# Patient Record
Sex: Female | Born: 1980 | Hispanic: No | Marital: Married | Smoking: Never smoker
Health system: Southern US, Community
[De-identification: ages and names within clinical notes are randomized; demographics above are authoritative.]

## PROBLEM LIST (undated history)

## (undated) ENCOUNTER — Inpatient Hospital Stay (HOSPITAL_COMMUNITY): Payer: Self-pay

## (undated) ENCOUNTER — Ambulatory Visit: Payer: BC Managed Care – PPO | Source: Home / Self Care

## (undated) DIAGNOSIS — K219 Gastro-esophageal reflux disease without esophagitis: Secondary | ICD-10-CM

## (undated) DIAGNOSIS — T7840XA Allergy, unspecified, initial encounter: Secondary | ICD-10-CM

## (undated) HISTORY — DX: Allergy, unspecified, initial encounter: T78.40XA

## (undated) HISTORY — DX: Gastro-esophageal reflux disease without esophagitis: K21.9

---

## 2002-04-16 ENCOUNTER — Other Ambulatory Visit: Admission: RE | Admit: 2002-04-16 | Discharge: 2002-04-16 | Payer: Self-pay | Admitting: *Deleted

## 2003-10-29 ENCOUNTER — Other Ambulatory Visit: Admission: RE | Admit: 2003-10-29 | Discharge: 2003-10-29 | Payer: Self-pay | Admitting: *Deleted

## 2004-11-02 ENCOUNTER — Other Ambulatory Visit: Admission: RE | Admit: 2004-11-02 | Discharge: 2004-11-02 | Payer: Self-pay | Admitting: Obstetrics and Gynecology

## 2005-11-11 ENCOUNTER — Other Ambulatory Visit: Admission: RE | Admit: 2005-11-11 | Discharge: 2005-11-11 | Payer: Self-pay | Admitting: Obstetrics and Gynecology

## 2011-01-21 ENCOUNTER — Other Ambulatory Visit (HOSPITAL_COMMUNITY): Payer: Self-pay | Admitting: Gynecology

## 2011-01-21 DIAGNOSIS — N979 Female infertility, unspecified: Secondary | ICD-10-CM

## 2011-01-26 ENCOUNTER — Ambulatory Visit (HOSPITAL_COMMUNITY): Admission: RE | Admit: 2011-01-26 | Payer: PRIVATE HEALTH INSURANCE | Source: Ambulatory Visit

## 2011-03-04 ENCOUNTER — Other Ambulatory Visit (HOSPITAL_COMMUNITY): Payer: Self-pay | Admitting: Gynecology

## 2011-03-04 DIAGNOSIS — N979 Female infertility, unspecified: Secondary | ICD-10-CM

## 2011-03-11 ENCOUNTER — Ambulatory Visit (HOSPITAL_COMMUNITY)
Admission: RE | Admit: 2011-03-11 | Discharge: 2011-03-11 | Disposition: A | Discharge status: Home / Self Care | Payer: PRIVATE HEALTH INSURANCE | Source: Ambulatory Visit | Attending: Gynecology | Admitting: Gynecology

## 2011-03-11 DIAGNOSIS — N979 Female infertility, unspecified: Secondary | ICD-10-CM

## 2012-10-27 ENCOUNTER — Ambulatory Visit: Payer: BC Managed Care – PPO | Admitting: Internal Medicine

## 2012-10-27 VITALS — BP 125/77 | HR 130 | Temp 98.6°F | Resp 17 | Ht 61.5 in | Wt 135.6 lb

## 2012-10-27 DIAGNOSIS — R197 Diarrhea, unspecified: Secondary | ICD-10-CM

## 2012-10-27 DIAGNOSIS — R1110 Vomiting, unspecified: Secondary | ICD-10-CM

## 2012-10-27 DIAGNOSIS — R110 Nausea: Secondary | ICD-10-CM

## 2012-10-27 DIAGNOSIS — R5383 Other fatigue: Secondary | ICD-10-CM

## 2012-10-27 LAB — POCT URINALYSIS DIPSTICK
Glucose, UA: NEGATIVE
Ketones, UA: 15
Leukocytes, UA: NEGATIVE
Protein, UA: NEGATIVE

## 2012-10-27 LAB — POCT UA - MICROSCOPIC ONLY: Crystals, Ur, HPF, POC: NEGATIVE

## 2012-10-27 LAB — POCT CBC
Hemoglobin: 13.1 g/dL (ref 12.2–16.2)
Lymph, poc: 2.7 (ref 0.6–3.4)
MCH, POC: 29 pg (ref 27–31.2)
MCV: 93.6 fL (ref 80–97)
MID (cbc): 0.3 (ref 0–0.9)
RBC: 4.52 M/uL (ref 4.04–5.48)
WBC: 5.4 10*3/uL (ref 4.6–10.2)

## 2012-10-27 MED ORDER — ONDANSETRON HCL 8 MG PO TABS: 8.0000 mg | ORAL_TABLET | Freq: Three times a day (TID) | ORAL | Status: DC | PRN

## 2012-10-27 MED ORDER — ONDANSETRON 4 MG PO TBDP
4.0000 mg | ORAL_TABLET | Freq: Once | ORAL | Status: AC
  Administered 2012-10-27 (×2): 4 mg via ORAL

## 2012-10-27 MED ORDER — ONDANSETRON 4 MG PO TBDP: 4.0000 mg | ORAL_TABLET | Freq: Once | ORAL | Status: DC

## 2012-10-27 NOTE — Progress Notes (Signed)
  Subjective:    Patient ID: Kathryn Erickson, female    DOB: 04/18/1981, 32 y.o.   MRN: [id number]  HPI Diarrhea and nausea and vomiting over the last 3 days, no fever noticed. Exposed to norvirus at work. No apetite, no abdominal pain, no urinary sxs. Is able to keep fluids down but nausea prevents the desire.   Review of Systems healthy    Objective:   Physical Exam  Constitutional: She appears well-developed and well-nourished. No distress.  HENT:  Right Ear: External ear normal.  Left Ear: External ear normal.  Mouth/Throat: Oropharynx is clear and moist.       Moist mms  Neck: Normal range of motion. Neck supple.  Cardiovascular: Normal rate, regular rhythm and normal heart sounds.   Pulmonary/Chest: Effort normal and breath sounds normal.  Abdominal: Soft. Bowel sounds are normal. She exhibits no mass. There is no tenderness.  Lymphadenopathy:    She has no cervical adenopathy.   Lying down 124/79 pulse 102  Stand 97/61 pulse 118  Will try oral hydration here gatorade/water/zofran 8mg  Results for orders placed in visit on 10/27/12  POCT UA - MICROSCOPIC ONLY      Component Value Range   WBC, Ur, HPF, POC 0-1     RBC, urine, microscopic 0-2     Bacteria, U Microscopic neg     Mucus, UA neg     Epithelial cells, urine per micros 1-3     Crystals, Ur, HPF, POC neg     Casts, Ur, LPF, POC neg     Yeast, UA neg    POCT URINALYSIS DIPSTICK      Component Value Range   Color, UA lt yellow     Clarity, UA clear     Glucose, UA neg     Bilirubin, UA neg     Ketones, UA 15     Spec Grav, UA 1.010     Blood, UA trace     pH, UA 7.0     Protein, UA neg     Urobilinogen, UA 0.2     Nitrite, UA neg     Leukocytes, UA Negative    POCT CBC      Component Value Range   WBC 5.4  4.6 - 10.2 K/uL   Lymph, poc 2.7  0.6 - 3.4   POC LYMPH PERCENT 50.1 (*) 10 - 50 %L   MID (cbc) 0.3  0 - 0.9   POC MID % 6.1  0 - 12 %M   POC Granulocyte 2.4  2 - 6.9   Granulocyte percent 43.8   37 - 80 %G   RBC 4.52  4.04 - 5.48 M/uL   Hemoglobin 13.1  12.2 - 16.2 g/dL   HCT, POC 78.2  95.6 - 47.9 %   MCV 93.6  80 - 97 fL   MCH, POC 29.0  27 - 31.2 pg   MCHC 31.0 (*) 31.8 - 35.4 g/dL   RDW, POC 21.3     Platelet Count, POC 360  142 - 424 K/uL   MPV 7.2  0 - 99.8 fL   Viral gastroenteritis     Assessment & Plan:  Zofran 8mg

## 2012-10-27 NOTE — Patient Instructions (Signed)
Viral Gastroenteritis Viral gastroenteritis is also known as stomach flu. This condition affects the stomach and intestinal tract. It can cause sudden diarrhea and vomiting. The illness typically lasts 3 to 8 days. Most people develop an immune response that eventually gets rid of the virus. While this natural response develops, the virus can make you quite ill. CAUSES   Many different viruses can cause gastroenteritis, such as rotavirus or noroviruses. You can catch one of these viruses by consuming contaminated food or water. You may also catch a virus by sharing utensils or other personal items with an infected person or by touching a contaminated surface. SYMPTOMS   The most common symptoms are diarrhea and vomiting. These problems can cause a severe loss of body fluids (dehydration) and a body salt (electrolyte) imbalance. Other symptoms may include:  Fever.   Headache.   Fatigue.   Abdominal pain.  DIAGNOSIS   Your caregiver can usually diagnose viral gastroenteritis based on your symptoms and a physical exam. A stool sample may also be taken to test for the presence of viruses or other infections. TREATMENT   This illness typically goes away on its own. Treatments are aimed at rehydration. The most serious cases of viral gastroenteritis involve vomiting so severely that you are not able to keep fluids down. In these cases, fluids must be given through an intravenous line (IV). HOME CARE INSTRUCTIONS    Drink enough fluids to keep your urine clear or pale yellow. Drink small amounts of fluids frequently and increase the amounts as tolerated.   Ask your caregiver for specific rehydration instructions.   Avoid:   Foods high in sugar.   Alcohol.   Carbonated drinks.   Tobacco.   Juice.   Caffeine drinks.   Extremely hot or cold fluids.   Fatty, greasy foods.   Too much intake of anything at one time.   Dairy products until 24 to 48 hours after diarrhea stops.   You  may consume probiotics. Probiotics are active cultures of beneficial bacteria. They may lessen the amount and number of diarrheal stools in adults. Probiotics can be found in yogurt with active cultures and in supplements.   Wash your hands well to avoid spreading the virus.   Only take over-the-counter or prescription medicines for pain, discomfort, or fever as directed by your caregiver. Do not give aspirin to children. Antidiarrheal medicines are not recommended.   Ask your caregiver if you should continue to take your regular prescribed and over-the-counter medicines.   Keep all follow-up appointments as directed by your caregiver.  SEEK IMMEDIATE MEDICAL CARE IF:    You are unable to keep fluids down.   You do not urinate at least once every 6 to 8 hours.   You develop shortness of breath.   You notice blood in your stool or vomit. This may look like coffee grounds.   You have abdominal pain that increases or is concentrated in one small area (localized).   You have persistent vomiting or diarrhea.   You have a fever.   The patient is a child younger than 3 months, and he or she has a fever.   The patient is a child older than 3 months, and he or she has a fever and persistent symptoms.   The patient is a child older than 3 months, and he or she has a fever and symptoms suddenly get worse.   The patient is a baby, and he or she has no tears when   crying.  MAKE SURE YOU:    Understand these instructions.   Will watch your condition.   Will get help right away if you are not doing well or get worse.  Document Released: 09/27/2005 Document Revised: 12/20/2011 Document Reviewed: 07/14/2011 Exit[employer] Patient Information 2013 [employer]   Norovirus Infection Norovirus illness is caused by a viral infection. The term norovirus refers to a group of viruses. Any of those viruses can cause norovirus illness. This illness is often referred to by other names such as viral  gastroenteritis, stomach flu, and food poisoning. Anyone can get a norovirus infection. People can have the illness multiple times during their lifetime. CAUSES   Norovirus is found in the stool or vomit of infected people. It is easily spread from person to person (contagious). People with norovirus are contagious from the moment they begin feeling ill. They may remain contagious for as long as 3 days to 2 weeks after recovery. People can become infected with the virus in several ways. This includes:  Eating food or drinking liquids that are contaminated with norovirus.   Touching surfaces or objects contaminated with norovirus, and then placing your hand in your mouth.   Having direct contact with a person who is infected and shows symptoms. This may occur while caring for someone with illness or while sharing foods or eating utensils with someone who is ill.  SYMPTOMS   Symptoms usually begin 1 to 2 days after ingestion of the virus. Symptoms may include:  Nausea.   Vomiting.   Diarrhea.   Stomach cramps.   Low-grade fever.   Chills.   Headache.   Muscle aches.   Tiredness.  Most people with norovirus illness get better within 1 to 2 days. Some people become dehydrated because they cannot drink enough liquids to replace those lost from vomiting and diarrhea. This is especially true for young children, the elderly, and others who are unable to care for themselves. DIAGNOSIS   Diagnosis is based on your symptoms and exam. Currently, only state public health laboratories have the ability to test for norovirus in stool or vomit. TREATMENT   No specific treatment exists for norovirus infections. No vaccine is available to prevent infections. Norovirus illness is usually brief in healthy people. If you are ill with vomiting and diarrhea, you should drink enough water and fluids to keep your urine clear or pale yellow. Dehydration is the most serious health effect that can result from  this infection. By drinking oral rehydration solution (ORS), people can reduce their chance of becoming dehydrated. There are many commercially available pre-made and powdered ORS designed to safely rehydrate people. These may be recommended by your caregiver. Replace any new fluid losses from diarrhea or vomiting with ORS as follows:  If your child weighs 10 kg or less (22 lb or less), give 60 to 120 ml ( to  cup or 2 to 4 oz) of ORS for each diarrheal stool or vomiting episode.   If your child weighs more than 10 kg (more than 22 lb), give 120 to 240 ml ( to 1 cup or 4 to 8 oz) of ORS for each diarrheal stool or vomiting episode.  HOME CARE INSTRUCTIONS    Follow all your caregiver's instructions.   Avoid sugar-free and alcoholic drinks while ill.   Only take over-the-counter or prescription medicines for pain, vomiting, diarrhea, or fever as directed by your caregiver.  You can decrease your chances of coming in contact with norovirus or spreading   it by following these steps:  Frequently wash your hands, especially after using the toilet, changing diapers, and before eating or preparing food.   Carefully wash fruits and vegetables. Cook shellfish before eating them.   Do not prepare food for others while you are infected and for at least 3 days after recovering from illness.   Thoroughly clean and disinfect contaminated surfaces immediately after an episode of illness using a bleach-based household cleaner.   Immediately remove and wash clothing or linens that may be contaminated with the virus.   Use the toilet to dispose of any vomit or stool. Make sure the surrounding area is kept clean.   Food that may have been contaminated by an ill person should be discarded.  SEEK IMMEDIATE MEDICAL CARE IF:    You develop symptoms of dehydration that do not improve with fluid replacement. This may include:   Excessive sleepiness.   Lack of tears.   Dry mouth.   Dizziness when  standing.   Weak pulse.  Document Released: 12/18/2002 Document Revised: 12/20/2011 Document Reviewed: 01/19/2010 [employer] Patient Information 2013 [employer].    

## 2013-10-01 ENCOUNTER — Encounter (HOSPITAL_COMMUNITY): Payer: Self-pay | Admitting: Emergency Medicine

## 2013-10-01 ENCOUNTER — Emergency Department (HOSPITAL_COMMUNITY): Payer: BC Managed Care – PPO

## 2013-10-01 ENCOUNTER — Emergency Department (HOSPITAL_COMMUNITY)
Admission: EM | Admit: 2013-10-01 | Discharge: 2013-10-01 | Disposition: A | Discharge status: Home / Self Care | Payer: BC Managed Care – PPO | Attending: Emergency Medicine | Admitting: Emergency Medicine

## 2013-10-01 DIAGNOSIS — Z9109 Other allergy status, other than to drugs and biological substances: Secondary | ICD-10-CM | POA: Insufficient documentation

## 2013-10-01 DIAGNOSIS — R000 Tachycardia, unspecified: Secondary | ICD-10-CM | POA: Insufficient documentation

## 2013-10-01 DIAGNOSIS — Z881 Allergy status to other antibiotic agents status: Secondary | ICD-10-CM | POA: Insufficient documentation

## 2013-10-01 DIAGNOSIS — R0602 Shortness of breath: Secondary | ICD-10-CM | POA: Insufficient documentation

## 2013-10-01 DIAGNOSIS — R630 Anorexia: Secondary | ICD-10-CM | POA: Insufficient documentation

## 2013-10-01 DIAGNOSIS — R197 Diarrhea, unspecified: Secondary | ICD-10-CM | POA: Insufficient documentation

## 2013-10-01 DIAGNOSIS — J069 Acute upper respiratory infection, unspecified: Secondary | ICD-10-CM | POA: Insufficient documentation

## 2013-10-01 DIAGNOSIS — R109 Unspecified abdominal pain: Secondary | ICD-10-CM | POA: Insufficient documentation

## 2013-10-01 DIAGNOSIS — R599 Enlarged lymph nodes, unspecified: Secondary | ICD-10-CM | POA: Insufficient documentation

## 2013-10-01 DIAGNOSIS — R509 Fever, unspecified: Secondary | ICD-10-CM | POA: Insufficient documentation

## 2013-10-01 DIAGNOSIS — R5381 Other malaise: Secondary | ICD-10-CM | POA: Insufficient documentation

## 2013-10-01 DIAGNOSIS — Z79899 Other long term (current) drug therapy: Secondary | ICD-10-CM | POA: Insufficient documentation

## 2013-10-01 LAB — CBC WITH DIFFERENTIAL/PLATELET
Eosinophils Absolute: 0 10*3/uL (ref 0.0–0.7)
Eosinophils Relative: 1 % (ref 0–5)
Hemoglobin: 12.5 g/dL (ref 12.0–15.0)
Lymphocytes Relative: 37 % (ref 12–46)
Lymphs Abs: 1.4 10*3/uL (ref 0.7–4.0)
MCH: 31.7 pg (ref 26.0–34.0)
MCV: 94.2 fL (ref 78.0–100.0)
Monocytes Absolute: 0.5 10*3/uL (ref 0.1–1.0)
Monocytes Relative: 13 % — ABNORMAL HIGH (ref 3–12)
RBC: 3.94 MIL/uL (ref 3.87–5.11)

## 2013-10-01 LAB — COMPREHENSIVE METABOLIC PANEL
Alkaline Phosphatase: 38 U/L — ABNORMAL LOW (ref 39–117)
BUN: 6 mg/dL (ref 6–23)
CO2: 30 mEq/L (ref 19–32)
Calcium: 8.6 mg/dL (ref 8.4–10.5)
GFR calc Af Amer: >90 mL/min (ref >90)
GFR calc non Af Amer: >90 mL/min (ref >90)
Glucose, Bld: 90 mg/dL (ref 70–99)
Potassium: 3.7 mEq/L (ref 3.5–5.1)
Total Protein: 6.9 g/dL (ref 6.0–8.3)

## 2013-10-01 LAB — RAPID STREP SCREEN (MED CTR MEBANE ONLY): Streptococcus, Group A Screen (Direct): NEGATIVE

## 2013-10-01 MED ORDER — SODIUM CHLORIDE 0.9 % IV BOLUS (SEPSIS)
1000.0000 mL | Freq: Once | INTRAVENOUS | Status: AC
  Administered 2013-10-01: 1000 mL via INTRAVENOUS

## 2013-10-01 NOTE — ED Notes (Signed)
Pt is here with sore throat, aches, diarrhea (thursday/friday).  Pt reports decreased appetite.  No vomiting.  Pt reports fever and took advil this am.  No fever at triage.  Abdominal pain yesterday.  No urinary symptoms

## 2013-10-01 NOTE — ED Notes (Signed)
Patient transported to X-ray 

## 2013-10-01 NOTE — ED Provider Notes (Signed)
I saw and evaluated the patient, reviewed the resident's note and I agree with the findings and plan.   .Face to face Exam:  General:  Awake HEENT:  Atraumatic Resp:  Normal effort Abd:  Nondistended Neuro:No focal weakness   Nelia Shi, MD 10/01/13 1301

## 2013-10-01 NOTE — ED Notes (Signed)
Pt states she started Zpack for sore throat

## 2013-10-01 NOTE — ED Provider Notes (Signed)
CSN: [id number]     Arrival date & time 10/01/13  7829 History   First MD Initiated Contact with Patient 10/01/13 1112     No chief complaint on file.  (Consider location/radiation/quality/duration/timing/severity/associated sxs/prior Treatment) HPI Ms. Tenia Vermette is a 32 y.o. female w/ no known PMHx, presents to the ED w/ complaints of mild cough, sore throat, fever, chills, and diarrhea since last Thursday. The patient claims this started with some sinus congestion and progressed to sore throat, cough, and mild SOB. The patient also admits to some fatigue and mild abdominal pain as well. She denies any nausea, vomiting, chest pain, dizziness, lightheadedness, or dysuria. The patient recently saw her PCP who prescribed her a Z-pak, and she says she took the first dose (500 mg) yesterday. She admits to having strep throat a couple times in the past, most recently last year.   Past Medical History  Diagnosis Date  . Allergy    History reviewed. No pertinent past surgical history. Family History  Problem Relation Age of Onset  . Lupus Maternal Grandmother   . Heart disease Paternal Grandmother   . Heart disease Paternal Grandfather    History  Substance Use Topics  . Smoking status: Never Smoker   . Smokeless tobacco: Not on file  . Alcohol Use: No   OB History   Grav Para Term Preterm Abortions TAB SAB Ect Mult Living                 Review of Systems General: Positive for fever, chills, decreased appetite, and fatigue. Denies diaphoresis. Respiratory: Positive for SOB. Denies cough, chest tightness, and wheezing.   Cardiovascular: Denies chest pain, palpitations and leg swelling.  Gastrointestinal: Positive for abdominal pain, diarrhea. Denies nausea, vomiting, constipation, blood in stool and abdominal distention.  Genitourinary: Denies dysuria, urgency, frequency, hematuria, flank pain and difficulty urinating.  Endocrine: Denies hot or cold intolerance, sweats, polyuria,  polydipsia. Musculoskeletal: Denies myalgias, back pain, joint swelling, arthralgias and gait problem.  Skin: Denies pallor, rash and wounds.  Neurological: Denies dizziness, seizures, syncope, weakness, lightheadedness, numbness and headaches.  Psychiatric/Behavioral: Denies mood changes, confusion, nervousness, sleep disturbance and agitation.  Allergies  Amoxicillin and Cephalexin  Home Medications   Current Outpatient Rx  Name  Route  Sig  Dispense  Refill  . diphenhydramine-acetaminophen (TYLENOL PM) 25-500 MG TABS   Oral   Take 1 tablet by mouth at bedtime as needed (sleep).         . fexofenadine (ALLEGRA) 180 MG tablet   Oral   Take 180 mg by mouth every morning.         . hydroxypropyl methylcellulose (ISOPTO TEARS) 2.5 % ophthalmic solution   Both Eyes   Place 1 drop into both eyes 4 (four) times daily as needed for dry eyes.         Marland Kitchen levocetirizine (XYZAL) 5 MG tablet   Oral   Take 5 mg by mouth every evening.         . ranitidine (ZANTAC) 150 MG tablet   Oral   Take 150 mg by mouth 2 (two) times daily.          Physical Exam Filed Vitals:   10/01/13 0944 10/01/13 1143  BP: 135/81   Pulse: 102 88  Temp: 98.5 F (36.9 C)   TempSrc: Oral   Resp: 18 16  Weight: 120 lb (54.432 kg)   SpO2: 100% 99%  General: Vital signs reviewed.  Patient is a well-developed and well-nourished,  in no acute distress and cooperative with exam. Alert and oriented x3.  Head: Normocephalic and atraumatic. Throat: Appears mildly, erythematous, no obvious exudates or drainage. Only mild cervical lymphadenopathy. Eyes: PERRL, EOMI, conjunctivae normal, No scleral icterus.  Neck: Supple, trachea midline, normal ROM, No JVD, masses, thyromegaly, or carotid bruit present.  Cardiovascular: Tachycardic, normal rhythm S1 normal, S2 normal, no murmurs, gallops, or rubs. Pulmonary/Chest: Normal respiratory effort, CTAB, no wheezes, rales, or rhonchi. Abdominal: Soft, non-tender,  non-distended, bowel sounds are normal, no masses, organomegaly, or guarding present.  Musculoskeletal: No joint deformities, erythema, or stiffness, ROM full and no nontender. Extremities: No swelling or edema,  pulses symmetric and intact bilaterally. No cyanosis or clubbing. Neurological: A&O x3, Strength is normal and symmetric bilaterally, cranial nerve II-XII are grossly intact, no focal motor deficit, sensory intact to light touch bilaterally.  Skin: Warm, dry and intact. No rashes or erythema. Psychiatric: Normal mood and affect. speech and behavior is normal. Cognition and memory are normal.    ED Course  Procedures (including critical care time) Labs Review Labs Reviewed  RAPID STREP SCREEN  CBC WITH DIFFERENTIAL  COMPREHENSIVE METABOLIC PANEL   Imaging Review No results found.  EKG Interpretation   None       MDM   Ms. Arwyn Eckmann is a 32 y.o. female w/ no known PMHx, presents to the ED w/ complaints of mild cough, sore throat, fever, chills, and diarrhea since last Thursday. Most likely viral URI at this time. Given history of sore throat, cough, and SOB, will rule out pneumonia, strep throat. Strep Pharyngitis score is 5-10% likelihood/risk of strep pharyngitis, therefore it is relatively unlikely at this time. -Rapid Strep -ve, culture pending. -CBC wnl -CMET wnl -CXR shows NACPD -Give 1L NS bolus  Most likely viral URI at this time. Patient started taking Z-pak yesterday, will advise to stop at this time. No evidence of pneumonia or strep pharyngitis. Stable for discharge with close follow up with PCP.  Courtney Paris, MD 10/01/13 1257

## 2013-10-03 LAB — CULTURE, GROUP A STREP

## 2014-04-15 ENCOUNTER — Encounter (HOSPITAL_COMMUNITY): Payer: Self-pay | Admitting: Emergency Medicine

## 2014-04-15 ENCOUNTER — Emergency Department (HOSPITAL_COMMUNITY)
Admission: EM | Admit: 2014-04-15 | Discharge: 2014-04-15 | Disposition: A | Discharge status: Home / Self Care | Payer: PRIVATE HEALTH INSURANCE | Attending: Emergency Medicine | Admitting: Emergency Medicine

## 2014-04-15 DIAGNOSIS — T50905A Adverse effect of unspecified drugs, medicaments and biological substances, initial encounter: Secondary | ICD-10-CM

## 2014-04-15 DIAGNOSIS — Z79899 Other long term (current) drug therapy: Secondary | ICD-10-CM | POA: Insufficient documentation

## 2014-04-15 DIAGNOSIS — T424X5A Adverse effect of benzodiazepines, initial encounter: Secondary | ICD-10-CM | POA: Insufficient documentation

## 2014-04-15 DIAGNOSIS — Z3202 Encounter for pregnancy test, result negative: Secondary | ICD-10-CM | POA: Insufficient documentation

## 2014-04-15 DIAGNOSIS — Z880 Allergy status to penicillin: Secondary | ICD-10-CM | POA: Insufficient documentation

## 2014-04-15 DIAGNOSIS — T391X5A Adverse effect of 4-Aminophenol derivatives, initial encounter: Secondary | ICD-10-CM | POA: Insufficient documentation

## 2014-04-15 DIAGNOSIS — F411 Generalized anxiety disorder: Secondary | ICD-10-CM | POA: Insufficient documentation

## 2014-04-15 DIAGNOSIS — R4182 Altered mental status, unspecified: Secondary | ICD-10-CM | POA: Insufficient documentation

## 2014-04-15 LAB — CBC WITH DIFFERENTIAL/PLATELET
Basophils Absolute: 0 10*3/uL (ref 0.0–0.1)
Basophils Relative: 0 % (ref 0–1)
Eosinophils Absolute: 0.1 10*3/uL (ref 0.0–0.7)
Eosinophils Relative: 1 % (ref 0–5)
HCT: 36.1 % (ref 36.0–46.0)
HEMOGLOBIN: 12.3 g/dL (ref 12.0–15.0)
LYMPHS ABS: 1.7 10*3/uL (ref 0.7–4.0)
Lymphocytes Relative: 28 % (ref 12–46)
MCH: 31.2 pg (ref 26.0–34.0)
MCHC: 34.1 g/dL (ref 30.0–36.0)
MCV: 91.6 fL (ref 78.0–100.0)
MONOS PCT: 9 % (ref 3–12)
Monocytes Absolute: 0.5 10*3/uL (ref 0.1–1.0)
NEUTROS ABS: 3.7 10*3/uL (ref 1.7–7.7)
NEUTROS PCT: 62 % (ref 43–77)
Platelets: 251 10*3/uL (ref 150–400)
RBC: 3.94 MIL/uL (ref 3.87–5.11)
RDW: 12.5 % (ref 11.5–15.5)
WBC: 6 10*3/uL (ref 4.0–10.5)

## 2014-04-15 LAB — RAPID URINE DRUG SCREEN, HOSP PERFORMED
Amphetamines: NOT DETECTED
BENZODIAZEPINES: POSITIVE — AB
Barbiturates: NOT DETECTED
Cocaine: NOT DETECTED
Opiates: NOT DETECTED
Tetrahydrocannabinol: NOT DETECTED

## 2014-04-15 LAB — ETHANOL: Alcohol, Ethyl (B): <11 mg/dL (ref 0–11)

## 2014-04-15 LAB — COMPREHENSIVE METABOLIC PANEL
ALK PHOS: 44 U/L (ref 39–117)
ALT: 17 U/L (ref 0–35)
ANION GAP: 13 (ref 5–15)
AST: 17 U/L (ref 0–37)
Albumin: 3.8 g/dL (ref 3.5–5.2)
BILIRUBIN TOTAL: 0.2 mg/dL — AB (ref 0.3–1.2)
BUN: 7 mg/dL (ref 6–23)
CHLORIDE: 103 meq/L (ref 96–112)
CO2: 23 mEq/L (ref 19–32)
Calcium: 9.1 mg/dL (ref 8.4–10.5)
Creatinine, Ser: 0.81 mg/dL (ref 0.50–1.10)
GFR calc non Af Amer: >90 mL/min (ref >90)
GLUCOSE: 113 mg/dL — AB (ref 70–99)
POTASSIUM: 3.8 meq/L (ref 3.7–5.3)
Sodium: 139 mEq/L (ref 137–147)
Total Protein: 7.1 g/dL (ref 6.0–8.3)

## 2014-04-15 LAB — SALICYLATE LEVEL

## 2014-04-15 LAB — AMMONIA: Ammonia: 15 umol/L (ref 11–60)

## 2014-04-15 LAB — POC URINE PREG, ED: Preg Test, Ur: NEGATIVE

## 2014-04-15 LAB — ACETAMINOPHEN LEVEL

## 2014-04-15 MED ORDER — SODIUM CHLORIDE 0.9 % IV SOLN
INTRAVENOUS | Status: DC
  Administered 2014-04-15: 05:00:00 via INTRAVENOUS

## 2014-04-15 NOTE — ED Notes (Signed)
Bed: [id number] Expected date:  Expected time:  Means of arrival:  Comments: EMS 84F hallucinations, OD on sleeping pills?

## 2014-04-15 NOTE — Discharge Instructions (Signed)
Do not take Tylenol PM in combination with sleeping pills.

## 2014-04-15 NOTE — ED Notes (Signed)
Per EMS report: pt took 5 tylenol PMs and her normal prescriptions of Xanax to help her sleep.  Pt went to sleep and then pt woke up and husband thought she was talking funny, having hallucinations, and lethargic.  On arrival EMS to ED, pt is a/o x 4 and talking in complete sentences without distress. Skin warm and dry. Pt ambulatory.

## 2014-04-15 NOTE — ED Provider Notes (Addendum)
CSN: [id number]     Arrival date & time 04/15/14  0345 History   First MD Initiated Contact with Patient 04/15/14 [phone]     Chief Complaint  Patient presents with  . Ingestion     (Consider location/radiation/quality/duration/timing/severity/associated sxs/prior Treatment) Patient is a 33 y.o. female presenting with Ingested Medication. The history is provided by the patient.  Ingestion   patient here complaining of altered mental status after taking Xanax and Tylenol with Benadryl. She is more depressed recently but denies that this is a suicide attempt. She normally takes 3 Tylenol p.m. at night to help her sleep. Husband denies any recent illnesses. No no fever or chills. The husband was awakened by her with altered mental status. She was experiencing hallucinations and disorganized thoughts. That has since improved. EMS was called and patient transported here. Symptoms have been gradually improving. No treatment used for this prior to arrival  Past Medical History  Diagnosis Date  . Allergy    History reviewed. No pertinent past surgical history. Family History  Problem Relation Age of Onset  . Lupus Maternal Grandmother   . Heart disease Paternal Grandmother   . Heart disease Paternal Grandfather    History  Substance Use Topics  . Smoking status: Never Smoker   . Smokeless tobacco: Not on file  . Alcohol Use: No   OB History   Grav Para Term Preterm Abortions TAB SAB Ect Mult Living                 Review of Systems  All other systems reviewed and are negative.     Allergies  Amoxicillin and Cephalexin  Home Medications   Prior to Admission medications   Medication Sig Start Date End Date Taking? Authorizing Provider  diphenhydramine-acetaminophen (TYLENOL PM) 25-500 MG TABS Take 1 tablet by mouth at bedtime as needed (sleep).    Historical Provider, MD  fexofenadine (ALLEGRA) 180 MG tablet Take 180 mg by mouth every morning.    Historical Provider, MD   hydroxypropyl methylcellulose (ISOPTO TEARS) 2.5 % ophthalmic solution Place 1 drop into both eyes 4 (four) times daily as needed for dry eyes.    Historical Provider, MD  levocetirizine (XYZAL) 5 MG tablet Take 5 mg by mouth every evening.    Historical Provider, MD  ranitidine (ZANTAC) 150 MG tablet Take 150 mg by mouth 2 (two) times daily.    Historical Provider, MD   BP 115/72  Pulse 96  Temp(Src) 98.4 F (36.9 C) (Oral)  Resp 18  Ht 5\' 2"  (1.575 m)  Wt 120 lb (54.432 kg)  BMI 21.94 kg/m2  SpO2 100%  LMP 03/28/2014 Physical Exam  Nursing note and vitals reviewed. Constitutional: She is oriented to person, place, and time. She appears well-developed and well-nourished.  Non-toxic appearance. No distress.  HENT:  Head: Normocephalic and atraumatic.  Eyes: Conjunctivae, EOM and lids are normal. Pupils are equal, round, and reactive to light.  Neck: Normal range of motion. Neck supple. No tracheal deviation present. No mass present.  Cardiovascular: Normal rate, regular rhythm and normal heart sounds.  Exam reveals no gallop.   No murmur heard. Pulmonary/Chest: Effort normal and breath sounds normal. No stridor. No respiratory distress. She has no decreased breath sounds. She has no wheezes. She has no rhonchi. She has no rales.  Abdominal: Soft. Normal appearance and bowel sounds are normal. She exhibits no distension. There is no tenderness. There is no rebound and no CVA tenderness.  Musculoskeletal: Normal range of  motion. She exhibits no edema and no tenderness.  Neurological: She is alert and oriented to person, place, and time. She has normal strength. No cranial nerve deficit or sensory deficit. GCS eye subscore is 4. GCS verbal subscore is 5. GCS motor subscore is 6.  Skin: Skin is warm and dry. No abrasion and no rash noted.  Psychiatric: Her mood appears anxious. Her speech is rapid and/or pressured. She is agitated. She is not actively hallucinating. She expresses no  suicidal plans and no homicidal plans.    ED Course  Procedures (including critical care time) Labs Review Labs Reviewed  ETHANOL  URINE RAPID DRUG SCREEN (HOSP PERFORMED)  SALICYLATE LEVEL  ACETAMINOPHEN LEVEL  CBC WITH DIFFERENTIAL  COMPREHENSIVE METABOLIC PANEL  AMMONIA  POC URINE PREG, ED    Imaging Review No results found.   EKG Interpretation   Date/Time:  Monday April 15 2014 03:55:01 EDT Ventricular Rate:  96 PR Interval:  135 QRS Duration: 97 QT Interval:  368 QTC Calculation: 465 R Axis:   75 Text Interpretation:  Sinus rhythm Borderline Q wave in anterolateral  leads Confirmed by Kealy Lewter  MD, Zooey Schreurs ([id number]) on 04/15/2014 4:43:29 AM      MDM   Final diagnoses:  None    6:14 AM Patient reevaluated multiple times and is more alert. According to her husband, she is improving at this time. Will continue to monitor and discharge him back to baseline  6:22 AM Patient's husband states that she is now at her baseline. Stable for discharge    Toy BakerAnthony T Ruchama Kubicek, MD 04/15/14 [phone]  Toy BakerAnthony T Janasha Barkalow, MD 04/15/14 [phone]

## 2015-01-21 ENCOUNTER — Inpatient Hospital Stay (HOSPITAL_COMMUNITY): Payer: PRIVATE HEALTH INSURANCE

## 2015-01-21 ENCOUNTER — Inpatient Hospital Stay (HOSPITAL_COMMUNITY)
Admission: AD | Admit: 2015-01-21 | Discharge: 2015-01-21 | Disposition: A | Discharge status: Home / Self Care | Payer: PRIVATE HEALTH INSURANCE | Source: Ambulatory Visit | Attending: Family Medicine | Admitting: Family Medicine

## 2015-01-21 ENCOUNTER — Encounter (HOSPITAL_COMMUNITY): Payer: Self-pay | Admitting: *Deleted

## 2015-01-21 DIAGNOSIS — O209 Hemorrhage in early pregnancy, unspecified: Secondary | ICD-10-CM | POA: Insufficient documentation

## 2015-01-21 DIAGNOSIS — Z3A01 Less than 8 weeks gestation of pregnancy: Secondary | ICD-10-CM | POA: Insufficient documentation

## 2015-01-21 DIAGNOSIS — R1032 Left lower quadrant pain: Secondary | ICD-10-CM | POA: Insufficient documentation

## 2015-01-21 DIAGNOSIS — N898 Other specified noninflammatory disorders of vagina: Secondary | ICD-10-CM | POA: Insufficient documentation

## 2015-01-21 LAB — URINALYSIS, ROUTINE W REFLEX MICROSCOPIC
Bilirubin Urine: NEGATIVE
GLUCOSE, UA: NEGATIVE mg/dL
KETONES UR: NEGATIVE mg/dL
LEUKOCYTES UA: NEGATIVE
Nitrite: NEGATIVE
Protein, ur: NEGATIVE mg/dL
Specific Gravity, Urine: <1.005 — ABNORMAL LOW (ref 1.005–1.030)
Urobilinogen, UA: 0.2 mg/dL (ref 0.0–1.0)
pH: 5.5 (ref 5.0–8.0)

## 2015-01-21 LAB — CBC
HCT: 36.6 % (ref 36.0–46.0)
Hemoglobin: 12.7 g/dL (ref 12.0–15.0)
MCH: 31.1 pg (ref 26.0–34.0)
MCHC: 34.7 g/dL (ref 30.0–36.0)
MCV: 89.5 fL (ref 78.0–100.0)
Platelets: 295 10*3/uL (ref 150–400)
RBC: 4.09 MIL/uL (ref 3.87–5.11)
RDW: 12.6 % (ref 11.5–15.5)
WBC: 11.3 10*3/uL — ABNORMAL HIGH (ref 4.0–10.5)

## 2015-01-21 LAB — WET PREP, GENITAL
Trich, Wet Prep: NONE SEEN
Yeast Wet Prep HPF POC: NONE SEEN

## 2015-01-21 LAB — URINE MICROSCOPIC-ADD ON

## 2015-01-21 LAB — HCG, QUANTITATIVE, PREGNANCY: hCG, Beta Chain, Quant, S: 386 m[IU]/mL — ABNORMAL HIGH (ref <5)

## 2015-01-21 LAB — POCT PREGNANCY, URINE: PREG TEST UR: POSITIVE — AB

## 2015-01-21 NOTE — Progress Notes (Signed)
Pt states she has been having a headache off and on

## 2015-01-21 NOTE — MAU Provider Note (Addendum)
History     CSN: [id number]  Arrival date and time: 01/21/15 1744   None     Chief Complaint  Patient presents with  . Vaginal Bleeding  . Abdominal Pain  . Possible Pregnancy   Vaginal Bleeding The patient's primary symptoms include vaginal bleeding. This is a new problem. The current episode started today. The problem has been unchanged. The pain is mild. She is pregnant. Associated symptoms include abdominal pain. Associated symptoms comments: Vaginal bleeding. The vaginal discharge was bloody and thin. The vaginal bleeding is lighter than menses. She has not been passing clots. She has not been passing tissue. She has tried nothing for the symptoms. It is unknown whether or not her partner has an STD.  Abdominal Pain  Possible Pregnancy Associated symptoms include abdominal pain.    34 y.o. G3P0020  presents to MAU after having light amount of bright red bleeding that started today at 400pm.  Pt states sheis having a lot of pain on her left side and some bleeding. Pt states she has positive pregnancy test. Pt states she has had to put on a pad because bleeding has"picked up a little bit"         Past Medical History  Diagnosis Date  . Allergy     History reviewed. No pertinent past surgical history.  Family History  Problem Relation Age of Onset  . Lupus Maternal Grandmother   . Heart disease Paternal Grandmother   . Heart disease Paternal Grandfather     History  Substance Use Topics  . Smoking status: Never Smoker   . Smokeless tobacco: Not on file  . Alcohol Use: No    Allergies:  Allergies  Allergen Reactions  . Amoxicillin Rash  . Cephalexin Rash    Prescriptions prior to admission  Medication Sig Dispense Refill Last Dose  . diphenhydramine-acetaminophen (TYLENOL PM) 25-500 MG TABS Take 1-2 tablets by mouth at bedtime as needed (sleep).    Past Month at Unknown time  . Prenatal Vit-Fe Fumarate-FA (PRENATAL MULTIVITAMIN) TABS tablet Take 1  tablet by mouth daily at 12 noon.   01/21/2015 at Unknown time  . ranitidine (ZANTAC) 150 MG tablet Take 150 mg by mouth daily.    01/21/2015 at Unknown time    Review of Systems  Gastrointestinal: Positive for abdominal pain.  Genitourinary: Positive for vaginal bleeding.  All other systems reviewed and are negative.  Physical Exam   Blood pressure 133/67, pulse 83, temperature 98.6 F (37 C), temperature source Oral, resp. rate 18, height 5' 0.24" (1.53 m), weight 72.122 kg (159 lb), last menstrual period 12/18/2014.  Physical Exam  Nursing note and vitals reviewed. Constitutional: She is oriented to person, place, and time. She appears well-developed and well-nourished. No distress.  HENT:  Head: Normocephalic.  Neck: Normal range of motion.  Cardiovascular: Normal rate.   Respiratory: Effort normal. No respiratory distress.  GI: Soft. There is no tenderness. There is no rebound and no guarding.  Genitourinary: Cervix exhibits discharge. There is bleeding in the vagina.  Musculoskeletal: Normal range of motion.  Neurological: She is alert and oriented to person, place, and time.  Skin: Skin is warm and dry.  Psychiatric: She has a normal mood and affect. Her behavior is normal. Judgment and thought content normal.   Results for orders placed or performed during the hospital encounter of 01/21/15 (from the past 24 hour(s))  Urinalysis, Routine w reflex microscopic     Status: Abnormal   Collection Time: 01/21/15  6:08 PM  Result Value Ref Range   Color, Urine STRAW (A) YELLOW   APPearance CLEAR CLEAR   Specific Gravity, Urine <1.005 (L) 1.005 - 1.030   pH 5.5 5.0 - 8.0   Glucose, UA NEGATIVE NEGATIVE mg/dL   Hgb urine dipstick LARGE (A) NEGATIVE   Bilirubin Urine NEGATIVE NEGATIVE   Ketones, ur NEGATIVE NEGATIVE mg/dL   Protein, ur NEGATIVE NEGATIVE mg/dL   Urobilinogen, UA 0.2 0.0 - 1.0 mg/dL   Nitrite NEGATIVE NEGATIVE   Leukocytes, UA NEGATIVE NEGATIVE  Urine  microscopic-add on     Status: Abnormal   Collection Time: 01/21/15  6:08 PM  Result Value Ref Range   Squamous Epithelial / LPF FEW (A) RARE  Pregnancy, urine POC     Status: Abnormal   Collection Time: 01/21/15  6:22 PM  Result Value Ref Range   Preg Test, Ur POSITIVE (A) NEGATIVE  ABO/Rh     Status: None (Preliminary result)   Collection Time: 01/21/15  6:50 PM  Result Value Ref Range   ABO/RH(D) A POS   hCG, quantitative, pregnancy     Status: Abnormal   Collection Time: 01/21/15  6:50 PM  Result Value Ref Range   hCG, Beta Chain, Quant, S 386 (H) <5 mIU/mL  CBC     Status: Abnormal   Collection Time: 01/21/15  6:50 PM  Result Value Ref Range   WBC 11.3 (H) 4.0 - 10.5 K/uL   RBC 4.09 3.87 - 5.11 MIL/uL   Hemoglobin 12.7 12.0 - 15.0 g/dL   HCT 16.1 09.6 - 04.5 %   MCV 89.5 78.0 - 100.0 fL   MCH 31.1 26.0 - 34.0 pg   MCHC 34.7 30.0 - 36.0 g/dL   RDW 40.9 81.1 - 91.4 %   Platelets 295 150 - 400 K/uL   US Ob Comp Less 14 Wks  01/21/2015   CLINICAL DATA:  Left lower quadrant pain, vaginal bleeding today.  EXAM: OBSTETRIC <14 WK [place] AND TRANSVAGINAL OB US  TECHNIQUE: Both transabdominal and transvaginal ultrasound examinations were performed for complete evaluation of the gestation as well as the maternal uterus, adnexal regions, and pelvic cul-de-sac. Transvaginal technique was performed to assess early pregnancy.  COMPARISON:  None.  FINDINGS: Intrauterine gestational sac: None visualized  Yolk sac:  Not visualized  Embryo:  Not visualized  Maternal uterus/adnexae: Right ovary not visualized. Complex area lateral to the left ovary measures 5.1 x 3.2 x 2.8 cm, in the area of patient's pain. Trace amount of free fluid within the pelvis.  IMPRESSION: Complex masslike area in the left adnexa measuring up to 5.1 x 3.2 x 2.8 cm with internal blood flow and trace free fluid in the pelvis. Findings are concerning for possible left ectopic pregnancy. Cannot exclude rupture.  Critical  Value/emergent results were called by telephone at the time of interpretation on 01/21/2015 at 9:46 pm to Dr. Illene Bolus , who verbally acknowledged these results.   Electronically Signed   By: Charlett Nose M.D.   On: 01/21/2015 21:48   US Ob Transvaginal  01/21/2015   CLINICAL DATA:  Left lower quadrant pain, vaginal bleeding today.  EXAM: OBSTETRIC <14 WK [place] AND TRANSVAGINAL OB US  TECHNIQUE: Both transabdominal and transvaginal ultrasound examinations were performed for complete evaluation of the gestation as well as the maternal uterus, adnexal regions, and pelvic cul-de-sac. Transvaginal technique was performed to assess early pregnancy.  COMPARISON:  None.  FINDINGS: Intrauterine gestational sac: None visualized  Yolk sac:  Not visualized  Embryo:  Not visualized  Maternal uterus/adnexae: Right ovary not visualized. Complex area lateral to the left ovary measures 5.1 x 3.2 x 2.8 cm, in the area of patient's pain. Trace amount of free fluid within the pelvis.  IMPRESSION: Complex masslike area in the left adnexa measuring up to 5.1 x 3.2 x 2.8 cm with internal blood flow and trace free fluid in the pelvis. Findings are concerning for possible left ectopic pregnancy. Cannot exclude rupture.  Critical Value/emergent results were called by telephone at the time of interpretation on 01/21/2015 at 9:46 pm to Dr. Illene BolusLORI CLEMMONS , who verbally acknowledged these results.   Electronically Signed   By: Charlett NoseKevin  Dover M.D.   On: 01/21/2015 21:48    MAU Course  Procedures  MDM Cbc Ultrasound  Beta HCG Quant ABO Dr Adrian BlackwaterStinson consulted for pt Assessment and Plan  Vaginal Bleeding in first Trimester Suspicious for Left Ectopic Pregnancy  Repeat quant in 48 hours.  Ectopic precautions given.  F/u immediately for sudden worsening of pain.   Clemmons,Lori Grissett 01/21/2015, 9:08 PM    Attestation of Attending Supervision of Advanced Practitioner (PA/CNM/NP): Evaluation and management procedures were  performed by the Advanced Practitioner under my supervision and collaboration.  I have reviewed the Advanced Practitioner's note and chart, and I agree with the management and plan.  I have seen and examined this patient.  Reported pain of 8/10 does not correlate with exam, as she appear more comfortable than that.  LLQ pain on exam, but without rebound tenderness or guarding.  I discussed this patient with Dr Debroah LoopArnold.  With trace fluid in the pelvis and the level of pain that the patient is currently in, I do not believe that she has a ruptured ectopic.  With the cystic mass in her left adnexa, it is suspicious for ectopic, but the quantitative bHCG of 386 makes definitive diagnosis less certain.  Dr Debroah LoopArnold recommended repeating quant in 48 hours to evaluate rise.  Ectopic precautions given to patient.  Rescan if symptoms are worsening or when quant reaches 1500.  Candelaria CelesteJacob Stinson, DO Attending Physician Faculty [facility], [facility]

## 2015-01-21 NOTE — Discharge Instructions (Signed)
Vaginal Bleeding During Pregnancy, First Trimester °A small amount of bleeding (spotting) from the vagina is relatively common in early pregnancy. It usually stops on its own. Various things may cause bleeding or spotting in early pregnancy. Some bleeding may be related to the pregnancy, and some may not. In most cases, the bleeding is normal and is not a problem. However, bleeding can also be a sign of something serious. Be sure to tell your health care provider about any vaginal bleeding right away. °Some possible causes of vaginal bleeding during the first trimester include: °· Infection or inflammation of the cervix. °· Growths (polyps) on the cervix. °· Miscarriage or threatened miscarriage. °· Pregnancy tissue has developed outside of the uterus and in a fallopian tube (tubal pregnancy). °· Tiny cysts have developed in the uterus instead of pregnancy tissue (molar pregnancy). °HOME CARE INSTRUCTIONS  °Watch your condition for any changes. The following actions may help to lessen any discomfort you are feeling: °· Follow your health care provider's instructions for limiting your activity. If your health care provider orders bed rest, you may need to stay in bed and only get up to use the bathroom. However, your health care provider may allow you to continue light activity. °· If needed, make plans for someone to help with your regular activities and responsibilities while you are on bed rest. °· Keep track of the number of pads you use each day, how often you change pads, and how soaked (saturated) they are. Write this down. °· Do not use tampons. Do not douche. °· Do not have sexual intercourse or orgasms until approved by your health care provider. °· If you pass any tissue from your vagina, save the tissue so you can show it to your health care provider. °· Only take over-the-counter or prescription medicines as directed by your health care provider. °· Do not take aspirin because it can make you  bleed. °· Keep all follow-up appointments as directed by your health care provider. °SEEK MEDICAL CARE IF: °· You have any vaginal bleeding during any part of your pregnancy. °· You have cramps or labor pains. °· You have a fever, not controlled by medicine. °SEEK IMMEDIATE MEDICAL CARE IF:  °· You have severe cramps in your back or belly (abdomen). °· You pass large clots or tissue from your vagina. °· Your bleeding increases. °· You feel light-headed or weak, or you have fainting episodes. °· You have chills. °· You are leaking fluid or have a gush of fluid from your vagina. °· You pass out while having a bowel movement. °MAKE SURE YOU: °· Understand these instructions. °· Will watch your condition. °· Will get help right away if you are not doing well or get worse. °Document Released: 07/07/2005 Document Revised: 10/02/2013 Document Reviewed: 06/04/2013 °ExitCare® Patient Information ©2015 [employer]. This information is not intended to replace advice given to you by your health care provider. Make sure you discuss any questions you have with your health care provider. ° °Threatened Miscarriage °A threatened miscarriage is when you have vaginal bleeding during your first 20 weeks of pregnancy but the pregnancy has not ended. Your doctor will do tests to make sure you are still pregnant. The cause of the bleeding may not be known. This condition does not mean your pregnancy will end. It does increase the risk of it ending (complete miscarriage). °HOME CARE  °· Make sure you keep all your doctor visits for prenatal care. °· Get plenty of rest. °·   Do not have sex or use tampons if you have vaginal bleeding.  Do not douche.  Do not smoke or use drugs.  Do not drink alcohol.  Avoid caffeine. GET HELP IF:  You have light bleeding from your vagina.  You have belly pain or cramping.  You have a fever. GET HELP RIGHT AWAY IF:   You have heavy bleeding from your vagina.  You have clots of blood  coming from your vagina.  You have bad pain or cramps in your low back or belly.  You have fever, chills, and bad belly pain. MAKE SURE YOU:   Understand these instructions.  Will watch your condition.  Will get help right away if you are not doing well or get worse. Document Released: 09/09/2008 Document Revised: 10/02/2013 Document Reviewed: 07/24/2013 [facility] Patient Information 2015 [place], [place]. This information is not intended to replace advice given to you by your health care provider. Make sure you discuss any questions you have with your health care provider.  Vaginal Bleeding During Pregnancy, First Trimester A small amount of bleeding (spotting) from the vagina is relatively common in early pregnancy. It usually stops on its own. Various things may cause bleeding or spotting in early pregnancy. Some bleeding may be related to the pregnancy, and some may not. In most cases, the bleeding is normal and is not a problem. However, bleeding can also be a sign of something serious. Be sure to tell your health care provider about any vaginal bleeding right away. Some possible causes of vaginal bleeding during the first trimester include:  Infection or inflammation of the cervix.  Growths (polyps) on the cervix.  Miscarriage or threatened miscarriage.  Pregnancy tissue has developed outside of the uterus and in a fallopian tube (tubal pregnancy).  Tiny cysts have developed in the uterus instead of pregnancy tissue (molar pregnancy). HOME CARE INSTRUCTIONS  Watch your condition for any changes. The following actions may help to lessen any discomfort you are feeling:  Follow your health care provider's instructions for limiting your activity. If your health care provider orders bed rest, you may need to stay in bed and only get up to use the bathroom. However, your health care provider may allow you to continue light activity.  If needed, make plans for someone to help with your  regular activities and responsibilities while you are on bed rest.  Keep track of the number of pads you use each day, how often you change pads, and how soaked (saturated) they are. Write this down.  Do not use tampons. Do not douche.  Do not have sexual intercourse or orgasms until approved by your health care provider.  If you pass any tissue from your vagina, save the tissue so you can show it to your health care provider.  Only take over-the-counter or prescription medicines as directed by your health care provider.  Do not take aspirin because it can make you bleed.  Keep all follow-up appointments as directed by your health care provider. SEEK MEDICAL CARE IF:  You have any vaginal bleeding during any part of your pregnancy.  You have cramps or labor pains.  You have a fever, not controlled by medicine. SEEK IMMEDIATE MEDICAL CARE IF:   You have severe cramps in your back or belly (abdomen).  You pass large clots or tissue from your vagina.  Your bleeding increases.  You feel light-headed or weak, or you have fainting episodes.  You have chills.  You are leaking fluid or  have a gush of fluid from your vagina.  You pass out while having a bowel movement. MAKE SURE YOU:  Understand these instructions.  Will watch your condition.  Will get help right away if you are not doing well or get worse. Document Released: 07/07/2005 Document Revised: 10/02/2013 Document Reviewed: 06/04/2013 [facility] Patient Information 2015 [place], [place]. This information is not intended to replace advice given to you by your health care provider. Make sure you discuss any questions you have with your health care provider. Ectopic Pregnancy An ectopic pregnancy is when the fertilized egg attaches (implants) outside the uterus. Most ectopic pregnancies occur in the fallopian tube. Rarely do ectopic pregnancies occur on the ovary, intestine, pelvis, or cervix. In an ectopic pregnancy, the  fertilized egg does not have the ability to develop into a normal, healthy baby.  A ruptured ectopic pregnancy is one in which the fallopian tube gets torn or bursts and results in internal bleeding. Often there is intense abdominal pain, and sometimes, vaginal bleeding. Having an ectopic pregnancy can be life threatening. If left untreated, this dangerous condition can lead to a blood transfusion, abdominal surgery, or even death. CAUSES  Damage to the fallopian tubes is the suspected cause in most ectopic pregnancies.  RISK FACTORS Depending on your circumstances, the risk of having an ectopic pregnancy will vary. The level of risk can be divided into three categories. High Risk  You have gone through infertility treatment.  You have had a previous ectopic pregnancy.  You have had previous tubal surgery.  You have had previous surgery to have the fallopian tubes tied (tubal ligation).  You have tubal problems or diseases.  You have been exposed to DES. DES is a medicine that was used until 1971 and had effects on babies whose mothers took the medicine.  You become pregnant while using an intrauterine device (IUD) for birth control. Moderate Risk  You have a history of infertility.  You have a history of a sexually transmitted infection (STI).  You have a history of pelvic inflammatory disease (PID).  You have scarring from endometriosis.  You have multiple sexual partners.  You smoke. Low Risk  You have had previous pelvic surgery.  You use vaginal douching.  You became sexually active before 32 years of age. SIGNS AND SYMPTOMS  An ectopic pregnancy should be suspected in anyone who has missed a period and has abdominal pain or bleeding.  You may experience normal pregnancy symptoms, such as:  Nausea.  Tiredness.  Breast tenderness.  Other symptoms may include:  Pain with intercourse.  Irregular vaginal bleeding or spotting.  Cramping or pain on one side  or in the lower abdomen.  Fast heartbeat.  Passing out while having a bowel movement.  Symptoms of a ruptured ectopic pregnancy and internal bleeding may include:  Sudden, severe pain in the abdomen and pelvis.  Dizziness or fainting.  Pain in the shoulder area. DIAGNOSIS  Tests that may be performed include:  A pregnancy test.  An ultrasound test.  Testing the specific level of pregnancy hormone in the bloodstream.  Taking a sample of uterus tissue (dilation and curettage, D&C).  Surgery to perform a visual exam of the inside of the abdomen using a thin, lighted tube with a tiny camera on the end (laparoscope). TREATMENT  An injection of a medicine called methotrexate may be given. This medicine causes the pregnancy tissue to be absorbed. It is given if:  The diagnosis is made early.  The fallopian  tube has not ruptured.  You are considered to be a good candidate for the medicine. Usually, pregnancy hormone blood levels are checked after methotrexate treatment. This is to be sure the medicine is effective. It may take 4-6 weeks for the pregnancy to be absorbed (though most pregnancies will be absorbed by 3 weeks). Surgical treatment may be needed. A laparoscope may be used to remove the pregnancy tissue. If severe internal bleeding occurs, a cut (incision) may be made in the lower abdomen (laparotomy), and the ectopic pregnancy is removed. This stops the bleeding. Part of the fallopian tube, or the whole tube, may be removed as well (salpingectomy). After surgery, pregnancy hormone tests may be done to be sure there is no pregnancy tissue left. You may receive a Rho (D) immune globulin shot if you are Rh negative and the father is Rh positive, or if you do not know the Rh type of the father. This is to prevent problems with any future pregnancy. SEEK IMMEDIATE MEDICAL CARE IF:  You have any symptoms of an ectopic pregnancy. This is a medical emergency. MAKE SURE  YOU:  Understand these instructions.  Will watch your condition.  Will get help right away if you are not doing well or get worse. Document Released: 11/04/2004 Document Revised: 02/11/2014 Document Reviewed: 04/26/2013 [facility] Patient Information 2015 [place]. This information is not intended to replace advice given to you by your health care provider. Make sure you discuss any questions you have with your health care provider.

## 2015-01-21 NOTE — MAU Note (Signed)
Been hurting today on the left side, getting worse and worse.  Started bleeding at 1600, dark red

## 2015-01-21 NOTE — MAU Note (Signed)
Pt states sheis having a lot of pain on her left side and some bleeding. Pt states she has positive pregnancy test. Pt states she has had to put on a pad because bleeding has"picked up a little bit"

## 2015-01-22 LAB — GC/CHLAMYDIA PROBE AMP (~~LOC~~) NOT AT ARMC
Chlamydia: NEGATIVE
Neisseria Gonorrhea: NEGATIVE

## 2015-01-22 LAB — ABO/RH: ABO/RH(D): A POS

## 2015-01-22 LAB — HIV ANTIBODY (ROUTINE TESTING W REFLEX): HIV Screen 4th Generation wRfx: NONREACTIVE

## 2015-01-23 ENCOUNTER — Inpatient Hospital Stay (HOSPITAL_COMMUNITY)
Admission: AD | Admit: 2015-01-23 | Discharge: 2015-01-23 | Disposition: A | Discharge status: Home / Self Care | Payer: PRIVATE HEALTH INSURANCE | Source: Ambulatory Visit | Attending: Obstetrics & Gynecology | Admitting: Obstetrics & Gynecology

## 2015-01-23 DIAGNOSIS — O039 Complete or unspecified spontaneous abortion without complication: Secondary | ICD-10-CM | POA: Insufficient documentation

## 2015-01-23 DIAGNOSIS — Z3A01 Less than 8 weeks gestation of pregnancy: Secondary | ICD-10-CM | POA: Insufficient documentation

## 2015-01-23 LAB — HCG, QUANTITATIVE, PREGNANCY: hCG, Beta Chain, Quant, S: 122 m[IU]/mL — ABNORMAL HIGH (ref <5)

## 2015-01-23 NOTE — MAU Note (Signed)
PT  TO RETURN  IN 1 WEEK  IN CLINIC  FOR  LABS

## 2015-01-23 NOTE — MAU Note (Signed)
PT  SAYS  SHE HAS SPOTTING  OCC WHEN SHE WIPES. -  THIS  IS LESS THAN WHEN SHE WAS HERE  ON Tuesday.    DENIES PAIN - CRAMPING.   ON Tuesday - SHE HAD  PAIN- 8..   NO MEDS  FOR  PAIN.

## 2015-01-23 NOTE — MAU Provider Note (Signed)
  History     CSN: [id number]  Arrival date and time: 01/23/15 1939   None     No chief complaint on file.  HPI  Kathryn Erickson is a 34 y.o. G3P0020 at 4310w1d who presents today for FU HCG. She denies any pain at this time. She states that her bleeding is much lighter than when she was here two day ago.  Past Medical History  Diagnosis Date  . Allergy     No past surgical history on file.  Family History  Problem Relation Age of Onset  . Lupus Maternal Grandmother   . Heart disease Paternal Grandmother   . Heart disease Paternal Grandfather     History  Substance Use Topics  . Smoking status: Never Smoker   . Smokeless tobacco: Not on file  . Alcohol Use: No    Allergies:  Allergies  Allergen Reactions  . Amoxicillin Rash  . Cephalexin Rash    Prescriptions prior to admission  Medication Sig Dispense Refill Last Dose  . Prenatal Vit-Fe Fumarate-FA (PRENATAL MULTIVITAMIN) TABS tablet Take 1 tablet by mouth daily at 12 noon.   01/21/2015 at Unknown time    ROS Physical Exam   Blood pressure 105/65, pulse 97, temperature 98.7 F (37.1 C), temperature source Oral, resp. rate 20, last menstrual period 12/18/2014.  Physical Exam  Nursing note and vitals reviewed. Constitutional: She is oriented to person, place, and time. She appears well-developed and well-nourished. No distress.  Cardiovascular: Normal rate.   Respiratory: Effort normal.  GI: Soft. There is no tenderness. There is no rebound.  Neurological: She is alert and oriented to person, place, and time.  Skin: Skin is warm and dry.  Psychiatric: She has a normal mood and affect.   Results for Elenora GammaORDAN, Mariellen (MRN [id number]) as of 01/23/2015 20:39  Ref. Range 01/21/2015 18:50 01/21/2015 20:55 01/21/2015 21:39 01/23/2015 20:00  hCG, Beta Chain, Quant, S Latest Ref Range: <5 mIU/mL 386 (H)   122 (H)   MAU Course  Procedures  MDM  Assessment and Plan   1. SAB (spontaneous abortion)    Bleeding precautions   Return to MAU as needed FU in clinic in 1 week  Follow-up Information    [facility]   Specialty:  Obstetrics and Gynecology   Why:  01/30/15 between 8-11 am    Contact information:   [address] [place] [id number] [phone]       Tawnya CrookHogan, Joselle Deeds Donovan 01/23/2015, 8:39 PM

## 2015-01-23 NOTE — MAU Note (Signed)
NOT IN LOBBY 

## 2015-01-23 NOTE — Discharge Instructions (Signed)
Miscarriage A miscarriage is the sudden loss of an unborn baby (fetus) before the 20th week of pregnancy. Most miscarriages happen in the first 3 months of pregnancy. Sometimes, it happens before a woman even knows she is pregnant. A miscarriage is also called a "spontaneous miscarriage" or "early pregnancy loss." Having a miscarriage can be an emotional experience. Talk with your caregiver about any questions you may have about miscarrying, the grieving process, and your future pregnancy plans. CAUSES   Problems with the fetal chromosomes that make it impossible for the baby to develop normally. Problems with the baby's genes or chromosomes are most often the result of errors that occur, by chance, as the embryo divides and grows. The problems are not inherited from the parents.  Infection of the cervix or uterus.   Hormone problems.   Problems with the cervix, such as having an incompetent cervix. This is when the tissue in the cervix is not strong enough to hold the pregnancy.   Problems with the uterus, such as an abnormally shaped uterus, uterine fibroids, or congenital abnormalities.   Certain medical conditions.   Smoking, drinking alcohol, or taking illegal drugs.   Trauma.  Often, the cause of a miscarriage is unknown.  SYMPTOMS   Vaginal bleeding or spotting, with or without cramps or pain.  Pain or cramping in the abdomen or lower back.  Passing fluid, tissue, or blood clots from the vagina. DIAGNOSIS  Your caregiver will perform a physical exam. You may also have an ultrasound to confirm the miscarriage. Blood or urine tests may also be ordered. TREATMENT   Sometimes, treatment is not necessary if you naturally pass all the fetal tissue that was in the uterus. If some of the fetus or placenta remains in the body (incomplete miscarriage), tissue left behind may become infected and must be removed. Usually, a dilation and curettage (D and C) procedure is performed.  During a D and C procedure, the cervix is widened (dilated) and any remaining fetal or placental tissue is gently removed from the uterus.  Antibiotic medicines are prescribed if there is an infection. Other medicines may be given to reduce the size of the uterus (contract) if there is a lot of bleeding.  If you have Rh negative blood and your baby was Rh positive, you will need a Rh immunoglobulin shot. This shot will protect any future baby from having Rh blood problems in future pregnancies. HOME CARE INSTRUCTIONS   Your caregiver may order bed rest or may allow you to continue light activity. Resume activity as directed by your caregiver.  Have someone help with home and family responsibilities during this time.   Keep track of the number of sanitary pads you use each day and how soaked (saturated) they are. Write down this information.   Do not use tampons. Do not douche or have sexual intercourse until approved by your caregiver.   Only take over-the-counter or prescription medicines for pain or discomfort as directed by your caregiver.   Do not take aspirin. Aspirin can cause bleeding.   Keep all follow-up appointments with your caregiver.   If you or your partner have problems with grieving, talk to your caregiver or seek counseling to help cope with the pregnancy loss. Allow enough time to grieve before trying to get pregnant again.  SEEK IMMEDIATE MEDICAL CARE IF:   You have severe cramps or pain in your back or abdomen.  You have a fever.  You pass large blood clots (walnut-sized   or larger) ortissue from your vagina. Save any tissue for your caregiver to inspect.   Your bleeding increases.   You have a thick, bad-smelling vaginal discharge.  You become lightheaded, weak, or you faint.   You have chills.  MAKE SURE YOU:  Understand these instructions.  Will watch your condition.  Will get help right away if you are not doing well or get  worse. Document Released: 03/23/2001 Document Revised: 01/22/2013 Document Reviewed: 11/16/2011 Exit[employer] Patient Information 2015 [employer]. This information is not intended to replace advice given to you by your health care provider. Make sure you discuss any questions you have with your health care provider.  

## 2015-01-30 ENCOUNTER — Other Ambulatory Visit: Payer: PRIVATE HEALTH INSURANCE

## 2015-01-30 ENCOUNTER — Telehealth: Payer: Self-pay | Admitting: Obstetrics & Gynecology

## 2015-01-30 NOTE — Telephone Encounter (Signed)
Left message for patient to return call to clinic.

## 2015-11-26 ENCOUNTER — Encounter (HOSPITAL_COMMUNITY): Payer: Self-pay | Admitting: *Deleted

## 2016-03-14 ENCOUNTER — Encounter (HOSPITAL_COMMUNITY): Payer: Self-pay | Admitting: Emergency Medicine

## 2016-03-14 ENCOUNTER — Emergency Department (HOSPITAL_COMMUNITY)
Admission: EM | Admit: 2016-03-14 | Discharge: 2016-03-14 | Disposition: A | Discharge status: Home / Self Care | Payer: Commercial Managed Care - HMO | Attending: Emergency Medicine | Admitting: Emergency Medicine

## 2016-03-14 DIAGNOSIS — Z79899 Other long term (current) drug therapy: Secondary | ICD-10-CM | POA: Diagnosis not present

## 2016-03-14 DIAGNOSIS — Y929 Unspecified place or not applicable: Secondary | ICD-10-CM | POA: Diagnosis not present

## 2016-03-14 DIAGNOSIS — W57XXXA Bitten or stung by nonvenomous insect and other nonvenomous arthropods, initial encounter: Secondary | ICD-10-CM | POA: Diagnosis not present

## 2016-03-14 DIAGNOSIS — Y999 Unspecified external cause status: Secondary | ICD-10-CM | POA: Diagnosis not present

## 2016-03-14 DIAGNOSIS — Y939 Activity, unspecified: Secondary | ICD-10-CM | POA: Insufficient documentation

## 2016-03-14 DIAGNOSIS — S90464A Insect bite (nonvenomous), right lesser toe(s), initial encounter: Secondary | ICD-10-CM | POA: Diagnosis present

## 2016-03-14 DIAGNOSIS — L03031 Cellulitis of right toe: Secondary | ICD-10-CM | POA: Diagnosis not present

## 2016-03-14 MED ORDER — DOXYCYCLINE HYCLATE 100 MG PO TABS
100.0000 mg | ORAL_TABLET | Freq: Once | ORAL | Status: AC
  Administered 2016-03-14: 100 mg via ORAL
  Filled 2016-03-14: qty 1

## 2016-03-14 MED ORDER — DOXYCYCLINE HYCLATE 100 MG PO CAPS: 100.0000 mg | ORAL_CAPSULE | Freq: Two times a day (BID) | ORAL | Status: DC

## 2016-03-14 NOTE — ED Provider Notes (Signed)
CSN: [id number]     Arrival date & time 03/14/16  1840 History  By signing my name below, I, Kathryn Erickson, attest that this documentation has been prepared under the direction and in the presence of [facility] CenterEmily Bev Drennen, PA-C. Electronically Signed: Tanda RockersMargaux Erickson, ED Scribe. 03/14/2016. 7:52 PM.   Chief Complaint  Patient presents with  . Insect Bite   The history is provided by the patient. No language interpreter was used.   HPI Comments: Kathryn Erickson is a 35 y.o. female who presents to the Emergency Department complaining of gradual onset, constant, throbbing, right 3rd and 4th toe pain, swelling, and redness x 4 days. Pt reports that she noticed a tick at the web spaces between the toes which she removed 4 days ago. She went camping 1 week ago and believes she may have been bitten by the tick then. She has been having chills, headaches, back pain, and nasal congestion for the past 2 days. Pt does not typically have headaches. No recent sick contact with similar symptoms. No risk of pregnancy. Denies fever, body aches, vomiting, diarrhea, rash, red streaking, or any other associated symptoms.   Past Medical History  Diagnosis Date  . Allergy    History reviewed. No pertinent past surgical history. Family History  Problem Relation Age of Onset  . Lupus Maternal Grandmother   . Heart disease Paternal Grandmother   . Heart disease Paternal Grandfather    Social History  Substance Use Topics  . Smoking status: Never Smoker   . Smokeless tobacco: None  . Alcohol Use: No   OB History    Gravida Para Term Preterm AB TAB SAB Ectopic Multiple Living   3    2  2         Review of Systems  Constitutional: Positive for chills. Negative for fever.  Gastrointestinal: Negative for nausea, vomiting and diarrhea.  Musculoskeletal: Positive for back pain, joint swelling and arthralgias. Negative for myalgias.  Skin: Positive for color change. Negative for rash.  Neurological: Positive for headaches.   All other systems reviewed and are negative.     Allergies  Amoxicillin and Cephalexin  Home Medications   Prior to Admission medications   Medication Sig Start Date End Date Taking? Authorizing Provider  Prenatal Vit-Fe Fumarate-FA (PRENATAL MULTIVITAMIN) TABS tablet Take 1 tablet by mouth daily at 12 noon.    Historical Provider, MD   BP 115/84 mmHg  Pulse 85  Temp(Src) 98.4 F (36.9 C) (Oral)  Resp 16  Ht 5\' 1"  (1.549 m)  Wt 153 lb (69.4 kg)  BMI 28.92 kg/m2  SpO2 99%  LMP 03/02/2016 (Exact Date)   Physical Exam  Constitutional: She appears well-developed and well-nourished. No distress.  HENT:  Head: Normocephalic and atraumatic.  Eyes: Conjunctivae are normal.  Neck: Normal range of motion. Neck supple.  Cardiovascular: Normal rate and regular rhythm.   Pulmonary/Chest: Effort normal and breath sounds normal. No respiratory distress. She has no wheezes. She has no rales.  Abdominal: Soft. She exhibits no distension. There is no tenderness. There is no rebound and no guarding.  Musculoskeletal: She exhibits edema and tenderness.  Erythema, edema, and tenderness at the base of the 3rd toe (betwen 3rd and 4th) on the right foot. No fluctuance or drainage.   Neurological: She is alert.  Skin: She is not diaphoretic. There is erythema.  Nursing note and vitals reviewed.   ED Course  Procedures (including critical care time)  DIAGNOSTIC STUDIES: Oxygen Saturation is 99% on RA,  normal by my interpretation.    COORDINATION OF CARE: 7:50 PM-Discussed treatment plan which includes Rx antibiotics with pt at bedside and pt agreed to plan.    MDM   Final diagnoses:  Tick bite  Cellulitis of toe of right foot   Afebrile, nontoxic patient with engorged tick found 4 days ago between toes several days after camping trip.  Now with cellulitis between toes.  Clinically no abscess.  Pt is afebrile and nontoxic appearing but c/o headache, chills, fatigue.   D/C home with  doxycycline x 7 days.  Discussed result, findings, treatment, and follow up  with patient.  Pt given return precautions.  Pt verbalizes understanding and agrees with plan.        I personally performed the services described in this documentation, which was scribed in my presence. The recorded information has been reviewed and is accurate.      Trixie Dredge, PA-C 03/14/16 2130  Lorre Nick, MD 03/14/16 2252

## 2016-03-14 NOTE — ED Notes (Signed)
Pt removed tick from between toes on right foot approximately 3 nights ago. Redness and swelling at the site. Denies fever.

## 2016-03-14 NOTE — Discharge Instructions (Signed)
Read the information below.  Use the prescribed medication as directed.  Please discuss all new medications with your pharmacist.  You may return to the Emergency Department at any time for worsening condition or any new symptoms that concern you.   If you develop fevers, uncontrolled pain, uncontrolled vomiting, or difficulty breathing or swallowing return to the ER for a recheck.      Tick Bite Information Ticks are insects that attach themselves to the skin and draw blood for food. There are various types of ticks. Common types include wood ticks and deer ticks. Most ticks live in shrubs and grassy areas. Ticks can climb onto your body when you make contact with leaves or grass where the tick is waiting. The most common places on the body for ticks to attach themselves are the scalp, neck, armpits, waist, and groin. Most tick bites are harmless, but sometimes ticks carry germs that cause diseases. These germs can be spread to a person during the tick's feeding process. The chance of a disease spreading through a tick bite depends on:   The type of tick.  Time of year.   How long the tick is attached.   Geographic location.  HOW CAN YOU PREVENT TICK BITES? Take these steps to help prevent tick bites when you are outdoors:  Wear protective clothing. Long sleeves and long pants are best.   Wear white clothes so you can see ticks more easily.  Tuck your pant legs into your socks.   If walking on a trail, stay in the middle of the trail to avoid brushing against bushes.  Avoid walking through areas with long grass.  Put insect repellent on all exposed skin and along boot tops, pant legs, and sleeve cuffs.   Check clothing, hair, and skin repeatedly and before going inside.   Brush off any ticks that are not attached.  Take a shower or bath as soon as possible after being outdoors.  WHAT IS THE PROPER WAY TO REMOVE A TICK? Ticks should be removed as soon as possible to help  prevent diseases caused by tick bites. 1. If latex gloves are available, put them on before trying to remove a tick.  2. Using fine-point tweezers, grasp the tick as close to the skin as possible. You may also use curved forceps or a tick removal tool. Grasp the tick as close to its head as possible. Avoid grasping the tick on its body. 3. Pull gently with steady upward pressure until the tick lets go. Do not twist the tick or jerk it suddenly. This may break off the tick's head or mouth parts. 4. Do not squeeze or crush the tick's body. This could force disease-carrying fluids from the tick into your body.  5. After the tick is removed, wash the bite area and your hands with soap and water or other disinfectant such as alcohol. 6. Apply a small amount of antiseptic cream or ointment to the bite site.  7. Wash and disinfect any instruments that were used.  Do not try to remove a tick by applying a hot match, petroleum jelly, or fingernail polish to the tick. These methods do not work and may increase the chances of disease being spread from the tick bite.  WHEN SHOULD YOU SEEK MEDICAL CARE? Contact your health care provider if you are unable to remove a tick from your skin or if a part of the tick breaks off and is stuck in the skin.  After a tick  bite, you need to be aware of signs and symptoms that could be related to diseases spread by ticks. Contact your health care provider if you develop any of the following in the days or weeks after the tick bite:  Unexplained fever.  Rash. A circular rash that appears days or weeks after the tick bite may indicate the possibility of Lyme disease. The rash may resemble a target with a bull's-eye and may occur at a different part of your body than the tick bite.  Redness and swelling in the area of the tick bite.   Tender, swollen lymph glands.   Diarrhea.   Weight loss.   Cough.   Fatigue.   Muscle, joint, or bone pain.   Abdominal  pain.   Headache.   Lethargy or a change in your level of consciousness.  Difficulty walking or moving your legs.   Numbness in the legs.   Paralysis.  Shortness of breath.   Confusion.   Repeated vomiting.    This information is not intended to replace advice given to you by your health care provider. Make sure you discuss any questions you have with your health care provider.   Document Released: 09/24/2000 Document Revised: 10/18/2014 Document Reviewed: 03/07/2013 Elsevier Interactive Patient Education [employer]

## 2016-05-16 ENCOUNTER — Encounter (HOSPITAL_COMMUNITY): Payer: Self-pay | Admitting: *Deleted

## 2016-05-16 ENCOUNTER — Emergency Department (HOSPITAL_COMMUNITY)
Admission: EM | Admit: 2016-05-16 | Discharge: 2016-05-16 | Disposition: A | Discharge status: Home / Self Care | Payer: Commercial Managed Care - HMO | Attending: Emergency Medicine | Admitting: Emergency Medicine

## 2016-05-16 DIAGNOSIS — W57XXXA Bitten or stung by nonvenomous insect and other nonvenomous arthropods, initial encounter: Secondary | ICD-10-CM | POA: Diagnosis not present

## 2016-05-16 DIAGNOSIS — L03116 Cellulitis of left lower limb: Secondary | ICD-10-CM | POA: Insufficient documentation

## 2016-05-16 DIAGNOSIS — Y999 Unspecified external cause status: Secondary | ICD-10-CM | POA: Insufficient documentation

## 2016-05-16 DIAGNOSIS — Y939 Activity, unspecified: Secondary | ICD-10-CM | POA: Diagnosis not present

## 2016-05-16 DIAGNOSIS — Y929 Unspecified place or not applicable: Secondary | ICD-10-CM | POA: Insufficient documentation

## 2016-05-16 DIAGNOSIS — S70362A Insect bite (nonvenomous), left thigh, initial encounter: Secondary | ICD-10-CM | POA: Diagnosis present

## 2016-05-16 MED ORDER — SULFAMETHOXAZOLE-TRIMETHOPRIM 800-160 MG PO TABS
1.0000 | ORAL_TABLET | Freq: Once | ORAL | Status: AC
  Administered 2016-05-16: 1 via ORAL
  Filled 2016-05-16: qty 1

## 2016-05-16 MED ORDER — SULFAMETHOXAZOLE-TRIMETHOPRIM 800-160 MG PO TABS: 1.0000 | ORAL_TABLET | Freq: Two times a day (BID) | ORAL | 0 refills | Status: DC

## 2016-05-16 NOTE — Progress Notes (Signed)
Pt has a spider bite ot the left side of her left thigh.

## 2016-05-16 NOTE — ED Triage Notes (Signed)
Pt complains of red raised area on left posterior thigh. Pt states she was camping over the weekend and noticed the area this morning. Pt states her friends saw teeth marks in the area.

## 2016-05-16 NOTE — ED Provider Notes (Signed)
[facility]-EMERGENCY DEPT Provider Note   CSN: [id number] Arrival date & time: 05/16/16  1704  First Provider Contact:   First MD Initiated Contact with Patient 05/16/16 1736     By signing my name below, I, Freida Busman, attest that this documentation has been prepared under the direction and in the presence of non-physician practitioner, Trixie Dredge, PA-C. Electronically Signed: Freida Busman, Scribe. 05/16/2016. 5:44 PM.   History   Chief Complaint Chief Complaint  Patient presents with  . Insect Bite   The history is provided by the patient. No language interpreter was used.    HPI Comments:  Kathryn Erickson is a 35 y.o. female who presents to the Emergency Department complaining of a circular area of redness to the  left thigh which she noticed today. She reports moderate pain and increased warmth to the site. Pt returned from camping trip (stayed in their camper) today believes she may have been bitten by an insect or spider, but does not remember this occuring. Her husband checked the area, no tick bite was seen.  She denies fever, chills, SOB and pruritus. No alleviating factors noted.   Past Medical History:  Diagnosis Date  . Allergy     There are no active problems to display for this patient.   History reviewed. No pertinent surgical history.  OB History    Gravida Para Term Preterm AB Living   3       2     SAB TAB Ectopic Multiple Live Births   2               Home Medications    Prior to Admission medications   Medication Sig Start Date End Date Taking? Authorizing Provider  Prenatal Vit-Fe Fumarate-FA (PRENATAL MULTIVITAMIN) TABS tablet Take 1 tablet by mouth daily at 12 noon.    Historical Provider, MD  sulfamethoxazole-trimethoprim (BACTRIM DS,SEPTRA DS) 800-160 MG tablet Take 1 tablet by mouth 2 (two) times daily. X 7 days 05/16/16   Trixie Dredge, PA-C    Family History Family History  Problem Relation Age of Onset  . Lupus Maternal Grandmother   . Heart  disease Paternal Grandmother   . Heart disease Paternal Grandfather     Social History Social History  Substance Use Topics  . Smoking status: Never Smoker  . Smokeless tobacco: Never Used  . Alcohol use No     Allergies   Amoxicillin and Cephalexin   Review of Systems Review of Systems  Constitutional: Negative for chills and fever.  Respiratory: Negative for shortness of breath.   Musculoskeletal: Negative for myalgias.  Skin: Positive for color change (erythema). Negative for wound.  Allergic/Immunologic: Negative for immunocompromised state.  Hematological: Does not bruise/bleed easily.  Psychiatric/Behavioral: Negative for self-injury.     Physical Exam Updated Vital Signs BP 124/92 (BP Location: Right Arm)   Pulse 108   Temp 98.4 F (36.9 C) (Oral)   Resp 18   LMP 05/02/2016   SpO2 100%   Physical Exam  Constitutional: She appears well-developed and well-nourished. No distress.  HENT:  Head: Normocephalic and atraumatic.  Neck: Neck supple.  Pulmonary/Chest: Effort normal.  Neurological: She is alert.  Skin: She is not diaphoretic.  Circular area on left lateral thigh that is erythematous, warmth and tenderness to palpation. No fluctuance, induration or discharge. No other focal areas of tenderness or edema. Compartments are soft   Psychiatric: Her behavior is normal.  Nursing note and vitals reviewed.    ED Treatments /  Results  DIAGNOSTIC STUDIES:  Oxygen Saturation is 100% on RA, normal by my interpretation.    COORDINATION OF CARE:  5:42 PM Discussed treatment plan with pt at bedside and pt agreed to plan.  Labs (all labs ordered are listed, but only abnormal results are displayed) Labs Reviewed - No data to display  EKG  EKG Interpretation None       Radiology No results found.  Procedures Procedures (including critical care time)  Medications Ordered in ED Medications  sulfamethoxazole-trimethoprim (BACTRIM DS,SEPTRA DS)  800-160 MG per tablet 1 tablet (not administered)     Initial Impression / Assessment and Plan / ED Course  I have reviewed the triage vital signs and the nursing notes.  Pertinent labs & imaging results that were available during my care of the patient were reviewed by me and considered in my medical decision making (see chart for details).  Clinical Course      Final Clinical Impressions(s) / ED Diagnoses  Patient presentation consistent with cellulitis vs insect sting. Pt is  Afebrile. No tachycardia, hypotension or other symptoms suggestive of severe infection. Pt advised to follow up for wound check in 2-3 days, sooner for worsening systemic symptoms.  Will discharge with bactrim. Return precautions discussed. Pt appears safe for discharge. Discussed result, findings, treatment, and follow up  with patient.  Pt given return precautions.  Pt verbalizes understanding and agrees with plan.      Final diagnoses:  Cellulitis of left lower extremity    New Prescriptions New Prescriptions   SULFAMETHOXAZOLE-TRIMETHOPRIM (BACTRIM DS,SEPTRA DS) 800-160 MG TABLET    Take 1 tablet by mouth 2 (two) times daily. X 7 days    I personally performed the services described in this documentation, which was scribed in my presence. The recorded information has been reviewed and is accurate.     LakelandEmily Shadiyah Wernli, PA-C 05/16/16 1812    Alvira MondayErin Schlossman, MD 05/18/16 [phone]

## 2016-05-16 NOTE — Discharge Instructions (Signed)
Read the information below.  Use the prescribed medication as directed.  Please discuss all new medications with your pharmacist.  You may return to the Emergency Department at any time for worsening condition or any new symptoms that concern you.   If you develop increased redness, swelling, pus draining from the wound, or fevers greater than 100.4, return to the ER immediately for a recheck.   °

## 2017-05-19 ENCOUNTER — Encounter (HOSPITAL_COMMUNITY): Payer: Self-pay | Admitting: Student

## 2017-05-19 ENCOUNTER — Inpatient Hospital Stay (HOSPITAL_COMMUNITY)
Admission: AD | Admit: 2017-05-19 | Discharge: 2017-05-19 | Disposition: A | Discharge status: Home / Self Care | Payer: Commercial Managed Care - HMO | Source: Ambulatory Visit | Attending: Obstetrics & Gynecology | Admitting: Obstetrics & Gynecology

## 2017-05-19 DIAGNOSIS — N912 Amenorrhea, unspecified: Secondary | ICD-10-CM | POA: Insufficient documentation

## 2017-05-19 DIAGNOSIS — Z3202 Encounter for pregnancy test, result negative: Secondary | ICD-10-CM

## 2017-05-19 LAB — HCG, QUANTITATIVE, PREGNANCY: hCG, Beta Chain, Quant, S: <1 m[IU]/mL (ref <5)

## 2017-05-19 LAB — URINALYSIS, ROUTINE W REFLEX MICROSCOPIC
BILIRUBIN URINE: NEGATIVE
Bacteria, UA: NONE SEEN
GLUCOSE, UA: NEGATIVE mg/dL
KETONES UR: NEGATIVE mg/dL
Leukocytes, UA: NEGATIVE
Nitrite: NEGATIVE
PH: 6 (ref 5.0–8.0)
Protein, ur: NEGATIVE mg/dL
Specific Gravity, Urine: 1.019 (ref 1.005–1.030)

## 2017-05-19 LAB — POCT PREGNANCY, URINE: Preg Test, Ur: NEGATIVE

## 2017-05-19 NOTE — [facility] Provider Note (Signed)
History     CSN: [id number]  Arrival date and time: 05/19/17 1043  First Provider Initiated Contact with Patient 05/19/17 1120      Chief Complaint  Patient presents with  . Possible Pregnancy  . Abdominal Pain   HPI Kathryn Erickson is a 36 y.o. G35P0030 female who presents with possible pregnancy & abdominal cramping. LMP was 2 months ago. States prior to then has regular monthly menses. No contraception in the last several years. Has taken multiple pregnancy tests in the last month; states some have been faint positive "she thinks" and some negative. Last faint positive was yesterday, followed by a negative test.  Reports intermittent lower abdominal cramping last week. Reports lower abdominal pressure today. Denies n/v/d, vaginal bleeding, vaginal discharge, or dysuria.  OB History    Gravida Para Term Preterm AB Living   3       3     SAB TAB Ectopic Multiple Live Births   3              Past Medical History:  Diagnosis Date  . Allergy     No past surgical history on file.  Family History  Problem Relation Age of Onset  . Lupus Maternal Grandmother   . Heart disease Paternal Grandmother   . Heart disease Paternal Grandfather     Social History  Substance Use Topics  . Smoking status: Never Smoker  . Smokeless tobacco: Never Used  . Alcohol use No    Allergies:  Allergies  Allergen Reactions  . Amoxicillin Rash  . Cephalexin Rash    Prescriptions Prior to Admission  Medication Sig Dispense Refill Last Dose  . Prenatal Vit-Fe Fumarate-FA (PRENATAL MULTIVITAMIN) TABS tablet Take 1 tablet by mouth daily at 12 noon.   01/21/2015 at Unknown time  . sulfamethoxazole-trimethoprim (BACTRIM DS,SEPTRA DS) 800-160 MG tablet Take 1 tablet by mouth 2 (two) times daily. X 7 days 14 tablet 0     Review of Systems  Constitutional: Negative.   Gastrointestinal: Positive for abdominal pain (none today). Negative for constipation, diarrhea, nausea and vomiting.  Genitourinary:  Negative.    Physical Exam   Blood pressure 118/72, pulse 88, temperature 98.3 F (36.8 C), resp. rate 18, height 5\' 1"  (1.549 m), weight 209 lb (94.8 kg), last menstrual period 03/11/2017, unknown if currently breastfeeding.  Physical Exam  Nursing note and vitals reviewed. Constitutional: She is oriented to person, place, and time. She appears well-developed and well-nourished. No distress.  HENT:  Head: Normocephalic and atraumatic.  Eyes: Conjunctivae are normal. Right eye exhibits no discharge. Left eye exhibits no discharge. No scleral icterus.  Neck: Normal range of motion.  Respiratory: Effort normal. No respiratory distress.  Neurological: She is alert and oriented to person, place, and time.  Skin: She is not diaphoretic.  Psychiatric: She has a normal mood and affect. Her behavior is normal. Judgment and thought content normal.    MAU Course  Procedures Results for orders placed or performed during the hospital encounter of 05/19/17 (from the past 24 hour(s))  Urinalysis, Routine w reflex microscopic     Status: Abnormal   Collection Time: 05/19/17 10:52 AM  Result Value Ref Range   Color, Urine YELLOW YELLOW   APPearance CLEAR CLEAR   Specific Gravity, Urine 1.019 1.005 - 1.030   pH 6.0 5.0 - 8.0   Glucose, UA NEGATIVE NEGATIVE mg/dL   Hgb urine dipstick SMALL (A) NEGATIVE   Bilirubin Urine NEGATIVE NEGATIVE   Ketones, ur  NEGATIVE NEGATIVE mg/dL   Protein, ur NEGATIVE NEGATIVE mg/dL   Nitrite NEGATIVE NEGATIVE   Leukocytes, UA NEGATIVE NEGATIVE   RBC / HPF 0-5 0 - 5 RBC/hpf   WBC, UA 0-5 0 - 5 WBC/hpf   Bacteria, UA NONE SEEN NONE SEEN   Squamous Epithelial / LPF 0-5 (A) NONE SEEN   Mucous PRESENT   Pregnancy, urine POC     Status: None   Collection Time: 05/19/17 11:06 AM  Result Value Ref Range   Preg Test, Ur NEGATIVE NEGATIVE  hCG, quantitative, pregnancy     Status: None   Collection Time: 05/19/17 11:20 AM  Result Value Ref Range   hCG, Beta Chain,  Quant, S <1 <5 mIU/mL    MDM Negative UPT. BHCG ordered per protocol as patient reports positive HPT.  BHCG <1 Assessment and Plan  A: 1. Pregnancy examination or test, negative result   2. Amenorrhea    P: Discharge home F/u with gyn of choice Discussed reasons to return to MAU  Judeth HornErin Carliss Porcaro 05/19/2017, 11:19 AM

## 2017-05-19 NOTE — Discharge Instructions (Signed)
°  [facility] Providers    [facility]       Phone: [phone]  [facility] Phone: [phone]  [facility] [place]  Phone: [phone]  [facility] [place]  Phone: [phone]  [facility]  Phone: [phone]  [facility]       Phone: [phone]  [facility]    Phone: [phone]   [facility]    Phone: [phone]  [facility]    Phone: [phone]  [facility]                                     Phone: [phone]  [facility]    Phone: [phone]  [facility]    Phone: [phone]  [facility]       Phone: [phone]   [facility]  Phone: [phone]  [facility]    Phone: [phone]  [facility]   Phone: [phone]  [facility]      Phone: [phone]  [facility]    Phone: [phone]     

## 2017-05-19 NOTE — MAU Note (Signed)
Pt reports she has not had a period x 2 moths. Had some pregnancy test some where positive some where negative. Took 2 last night one was faintly positive the other was negative. Pt reports having cramping on and off for sevral weeks feeling more pelvic pressure today.

## 2017-10-01 ENCOUNTER — Other Ambulatory Visit: Payer: Self-pay

## 2017-10-01 ENCOUNTER — Emergency Department (HOSPITAL_BASED_OUTPATIENT_CLINIC_OR_DEPARTMENT_OTHER)
Admission: EM | Admit: 2017-10-01 | Discharge: 2017-10-01 | Disposition: A | Discharge status: Home / Self Care | Payer: Self-pay | Attending: Emergency Medicine | Admitting: Emergency Medicine

## 2017-10-01 ENCOUNTER — Emergency Department (HOSPITAL_BASED_OUTPATIENT_CLINIC_OR_DEPARTMENT_OTHER): Payer: Self-pay

## 2017-10-01 ENCOUNTER — Encounter (HOSPITAL_BASED_OUTPATIENT_CLINIC_OR_DEPARTMENT_OTHER): Payer: Self-pay | Admitting: Emergency Medicine

## 2017-10-01 DIAGNOSIS — B9789 Other viral agents as the cause of diseases classified elsewhere: Secondary | ICD-10-CM | POA: Insufficient documentation

## 2017-10-01 DIAGNOSIS — J069 Acute upper respiratory infection, unspecified: Secondary | ICD-10-CM | POA: Insufficient documentation

## 2017-10-01 MED ORDER — BENZONATATE 100 MG PO CAPS: 100.0000 mg | ORAL_CAPSULE | Freq: Three times a day (TID) | ORAL | 0 refills | Status: DC

## 2017-10-01 MED ORDER — GUAIFENESIN-CODEINE 100-10 MG/5ML PO SYRP: 5.0000 mL | ORAL_SOLUTION | Freq: Three times a day (TID) | ORAL | 0 refills | Status: DC | PRN

## 2017-10-01 NOTE — Discharge Instructions (Signed)
Your chest xray was reassuring, no pneumonia.   I have written you a prescription for two cough medicines. One of them is a syrup with codeine in it, this can make you drowsy so please do not drive, work or drink alcohol while taking it.  You can take over the counter sudafed for congestion.  Please take 600mg  ibuprofen for any headache you have.   Return to the ER if you develop worsening cough with fever greater than 100.74F, have trouble breathing due to cough or have any new or worsening symptoms.

## 2017-10-01 NOTE — ED Provider Notes (Signed)
[place] EMERGENCY DEPARTMENT Provider Note   CSN: [id number] Arrival date & time: 10/01/17  3329     History   Chief Complaint Chief Complaint  Patient presents with  . Cough    HPI Taralee Pierre is a 36 y.o. female.  HPI  Ms. Pinales is a 36yo female with a history of allergies who presents emergency department for evaluation of cough and congestion.  She states that her symptoms started 4 days ago.  Endorses a productive cough of a yellow/white sputum.  Has some shortness of breath with coughing but none at rest.  States that her head feels stuffy due to congestion. Also reports a mild scratchy sore throat and right ear fullneess. Has tried OTC Alk seltzer cold and flu at home without relief of her symptoms. She denies fever, chest pain, N/V, abdominal pain, headaches, dysphagia, trismus. Denies sick contacts at home.   Past Medical History:  Diagnosis Date  . Allergy     There are no active problems to display for this patient.   History reviewed. No pertinent surgical history.  OB History    Gravida Para Term Preterm AB Living   3       3     SAB TAB Ectopic Multiple Live Births   3               Home Medications    Prior to Admission medications   Medication Sig Start Date End Date Taking? Authorizing Provider  diphenhydramine-acetaminophen (TYLENOL PM) 25-500 MG TABS tablet Take 1 tablet by mouth at bedtime as needed.    [provider]    Family History Family History  Problem Relation Age of Onset  . Lupus Maternal Grandmother   . Heart disease Paternal Grandmother   . Heart disease Paternal Grandfather     Social History Social History   Tobacco Use  . Smoking status: Never Smoker  . Smokeless tobacco: Never Used  Substance Use Topics  . Alcohol use: No  . Drug use: No     Allergies   Amoxicillin and Cephalexin   Review of Systems Review of Systems  Constitutional: Negative for chills, fatigue and fever.  HENT:  Positive for congestion, ear pain, postnasal drip, rhinorrhea, sinus pressure and sore throat. Negative for ear discharge, facial swelling, hearing loss, sinus pain and trouble swallowing.   Respiratory: Positive for cough and shortness of breath. Negative for chest tightness and wheezing.   Cardiovascular: Negative for chest pain.  Gastrointestinal: Negative for abdominal pain, diarrhea, nausea and vomiting.  Neurological: Negative for headaches.     Physical Exam Updated Vital Signs BP 136/80 (BP Location: Right Arm)   Pulse 92   Temp 98.2 F (36.8 C) (Oral)   Resp 20   Ht _0  (1.549 m)   Wt 84.8 kg (187 lb)   LMP 09/19/2017   SpO2 100%   BMI 35.33 kg/m   Physical Exam  Constitutional: She appears well-developed and well-nourished. No distress.  HENT:  Head: Normocephalic and atraumatic.  Right Ear: External ear normal.  Left Ear: External ear normal.  Bilateral TMs with good cone of light.  Mucous membranes moist.  Oropharynx mildly erythematous, no tonsillar exudate.  Uvula midline.  No trismus.  Able to handle oral secretions.  Clear rhinorrhea in the bilateral nares.  Eyes: Conjunctivae are normal. Pupils are equal, round, and reactive to light. Right eye exhibits no discharge. Left eye exhibits no discharge. No scleral icterus.  Neck: Normal range  of motion. Neck supple.  Cardiovascular: Normal rate, regular rhythm and intact distal pulses. Exam reveals no friction rub.  No murmur heard. Pulmonary/Chest: Effort normal and breath sounds normal. No stridor. No respiratory distress. She has no wheezes. She has no rales.  Dry cough with deep inspiration.   Lymphadenopathy:    She has no cervical adenopathy.  Neurological: She is alert. Coordination normal.  Skin: Skin is warm and dry. She is not diaphoretic.  Psychiatric: She has a normal mood and affect. Her behavior is normal.  Nursing note and vitals reviewed.    ED Treatments / Results  Labs (all labs ordered  are listed, but only abnormal results are displayed) Labs Reviewed - No data to display  EKG  EKG Interpretation None       Radiology No results found.  Procedures Procedures (including critical care time)  Medications Ordered in ED Medications - No data to display   Initial Impression / Assessment and Plan / ED Course  I have reviewed the triage vital signs and the nursing notes.  Pertinent labs & imaging results that were available during my care of the patient were reviewed by me and considered in my medical decision making (see chart for details).    Pt CXR negative for acute infiltrate. Patients symptoms are consistent with URI, likely viral etiology. Discussed that antibiotics are not indicated for viral infections. Pt will be discharged with symptomatic treatment.  Discussed return precautions. She verbalizes understanding and is agreeable with plan. Pt is hemodynamically stable & in NAD prior to dc.  Final Clinical Impressions(s) / ED Diagnoses   Final diagnoses:  Viral URI with cough    ED Discharge Orders        Ordered    benzonatate (TESSALON) 100 MG capsule  Every 8 hours     10/01/17 1015    guaiFENesin-codeine (ROBITUSSIN AC) 100-10 MG/5ML syrup  3 times daily PRN     10/01/17 1015       Glyn Ade, PA-C 10/01/17 1016    Forde Dandy, MD 10/01/17 1538

## 2017-10-01 NOTE — ED Triage Notes (Signed)
Cough and congestion x 4 days. Taking alka seltzer cold without relief.

## 2017-10-29 ENCOUNTER — Encounter (HOSPITAL_BASED_OUTPATIENT_CLINIC_OR_DEPARTMENT_OTHER): Payer: Self-pay | Admitting: *Deleted

## 2017-10-29 ENCOUNTER — Emergency Department (HOSPITAL_BASED_OUTPATIENT_CLINIC_OR_DEPARTMENT_OTHER)
Admission: EM | Admit: 2017-10-29 | Discharge: 2017-10-29 | Disposition: A | Discharge status: Home / Self Care | Payer: Self-pay | Attending: Emergency Medicine | Admitting: Emergency Medicine

## 2017-10-29 ENCOUNTER — Other Ambulatory Visit: Payer: Self-pay

## 2017-10-29 ENCOUNTER — Emergency Department (HOSPITAL_BASED_OUTPATIENT_CLINIC_OR_DEPARTMENT_OTHER): Payer: Self-pay

## 2017-10-29 DIAGNOSIS — R070 Pain in throat: Secondary | ICD-10-CM | POA: Insufficient documentation

## 2017-10-29 DIAGNOSIS — R0982 Postnasal drip: Secondary | ICD-10-CM | POA: Insufficient documentation

## 2017-10-29 DIAGNOSIS — J40 Bronchitis, not specified as acute or chronic: Secondary | ICD-10-CM | POA: Insufficient documentation

## 2017-10-29 DIAGNOSIS — R0789 Other chest pain: Secondary | ICD-10-CM | POA: Insufficient documentation

## 2017-10-29 LAB — PREGNANCY, URINE: Preg Test, Ur: NEGATIVE

## 2017-10-29 MED ORDER — PREDNISONE 10 MG PO TABS: 40.0000 mg | ORAL_TABLET | Freq: Every day | ORAL | 0 refills | Status: DC

## 2017-10-29 MED ORDER — ALBUTEROL SULFATE HFA 108 (90 BASE) MCG/ACT IN AERS: 1.0000 | INHALATION_SPRAY | Freq: Four times a day (QID) | RESPIRATORY_TRACT | 0 refills | Status: DC | PRN

## 2017-10-29 MED ORDER — IPRATROPIUM-ALBUTEROL 0.5-2.5 (3) MG/3ML IN SOLN
3.0000 mL | Freq: Four times a day (QID) | RESPIRATORY_TRACT | Status: DC
  Administered 2017-10-29: 3 mL via RESPIRATORY_TRACT
  Filled 2017-10-29: qty 3

## 2017-10-29 MED ORDER — PREDNISONE 50 MG PO TABS
60.0000 mg | ORAL_TABLET | Freq: Once | ORAL | Status: AC
  Administered 2017-10-29: 60 mg via ORAL
  Filled 2017-10-29: qty 1

## 2017-10-29 NOTE — ED Triage Notes (Signed)
Pt c/o cough x 1 month

## 2017-10-29 NOTE — ED Notes (Signed)
NAD at this time. Pt is stable and going home.  

## 2017-10-29 NOTE — Discharge Instructions (Signed)
Take the prednisone as directed for the next 5 days.  Use the albuterol inhaler 1-2 puffs every 6 hours for the next 7 days.  Chest x-ray negative for pneumonia.  Symptoms most consistent with bronchitis.  Work note provided.

## 2017-10-29 NOTE — ED Provider Notes (Signed)
[facility] EMERGENCY DEPARTMENT Provider Note   CSN: [id number] Arrival date & time: 10/29/17  0930     History   Chief Complaint Chief Complaint  Patient presents with  . Cough    HPI Kathryn Erickson is a 37 y.o. female.  Patient with a one-month history of cough nonproductive.  But in the last few days had some new symptoms were cough is become more severe not coughing up any phlegm thinks is a little bit of postnasal drainage.  Mild sore throat.  No real fevers.  No real body aches.  Has some chest wall soreness due to the coughing.  Patient had a history of bronchitis frequently in the past.  No known history of asthma.      Past Medical History:  Diagnosis Date  . Allergy     There are no active problems to display for this patient.   History reviewed. No pertinent surgical history.  OB History    Gravida Para Term Preterm AB Living   3       3     SAB TAB Ectopic Multiple Live Births   3               Home Medications    Prior to Admission medications   Medication Sig Start Date End Date Taking? Authorizing Provider  albuterol (PROVENTIL HFA;VENTOLIN HFA) 108 (90 Base) MCG/ACT inhaler Inhale 1-2 puffs into the lungs every 6 (six) hours as needed for wheezing or shortness of breath. 10/29/17   Vanetta MuldersZackowski, Mazie Fencl, MD  benzonatate (TESSALON) 100 MG capsule Take 1 capsule (100 mg total) by mouth every 8 (eight) hours. 10/01/17   Kellie ShropshireShrosbree, Emily J, PA-C  diphenhydramine-acetaminophen (TYLENOL PM) 25-500 MG TABS tablet Take 1 tablet by mouth at bedtime as needed.    [provider]  guaiFENesin-codeine (ROBITUSSIN AC) 100-10 MG/5ML syrup Take 5 mLs by mouth 3 (three) times daily as needed for cough. 10/01/17   Kellie ShropshireShrosbree, Emily J, PA-C  predniSONE (DELTASONE) 10 MG tablet Take 4 tablets (40 mg total) by mouth daily. 10/29/17   Vanetta MuldersZackowski, Gurfateh Mcclain, MD    Family History Family History  Problem Relation Age of Onset  . Lupus Maternal Grandmother   .  Heart disease Paternal Grandmother   . Heart disease Paternal Grandfather     Social History Social History   Tobacco Use  . Smoking status: Never Smoker  . Smokeless tobacco: Never Used  Substance Use Topics  . Alcohol use: No  . Drug use: No     Allergies   Amoxicillin and Cephalexin   Review of Systems Review of Systems  Constitutional: Negative for fever.  HENT: Negative for trouble swallowing.   Eyes: Negative for redness.  Respiratory: Positive for cough, shortness of breath and wheezing.   Cardiovascular: Positive for chest pain.  Gastrointestinal: Negative for abdominal pain, nausea and vomiting.  Genitourinary: Negative for dysuria.  Musculoskeletal: Positive for back pain.  Skin: Negative for rash.  Neurological: Negative for headaches.  Hematological: Does not bruise/bleed easily.  Psychiatric/Behavioral: Negative for confusion.     Physical Exam Updated Vital Signs BP (!) 165/87   Pulse (!) 112   Temp 99.3 F (37.4 C)   Resp 20   Ht 1.549 m (5\' 1" )   Wt 76.2 kg (168 lb)   LMP 10/17/2017   SpO2 100%   BMI 31.74 kg/m   Physical Exam  Constitutional: She is oriented to person, place, and time. She appears well-developed and well-nourished. No  distress.  HENT:  Head: Normocephalic and atraumatic.  Mouth/Throat: Oropharynx is clear and moist. No oropharyngeal exudate.  Eyes: EOM are normal. Pupils are equal, round, and reactive to light.  Neck: Normal range of motion. Neck supple.  Cardiovascular: Normal rate, regular rhythm and normal heart sounds.  Pulmonary/Chest: Effort normal and breath sounds normal. She has no wheezes.  Abdominal: Soft. Bowel sounds are normal. There is no tenderness.  Musculoskeletal: Normal range of motion. She exhibits no edema.  Neurological: She is alert and oriented to person, place, and time. No cranial nerve deficit or sensory deficit. She exhibits normal muscle tone. Coordination normal.  Skin: Skin is warm.    Nursing note and vitals reviewed.    ED Treatments / Results  Labs (all labs ordered are listed, but only abnormal results are displayed) Labs Reviewed  PREGNANCY, URINE    EKG  EKG Interpretation None       Radiology Dg Chest 2 View  Result Date: 10/29/2017 CLINICAL DATA:  Cough for 4 days.  Back pain. EXAM: CHEST  2 VIEW COMPARISON:  10/01/2017 FINDINGS: The heart size and mediastinal contours are within normal limits. Both lungs are clear. The visualized skeletal structures are unremarkable. No pleural effusions. Bilateral nipple piercings. IMPRESSION: No active cardiopulmonary disease. Electronically Signed   By: Richarda Overlie M.D.   On: 10/29/2017 10:07    Procedures Procedures (including critical care time)  Medications Ordered in ED Medications  ipratropium-albuterol (DUONEB) 0.5-2.5 (3) MG/3ML nebulizer solution 3 mL (3 mLs Nebulization Given 10/29/17 1022)  predniSONE (DELTASONE) tablet 60 mg (not administered)     Initial Impression / Assessment and Plan / ED Course  I have reviewed the triage vital signs and the nursing notes.  Pertinent labs & imaging results that were available during my care of the patient were reviewed by me and considered in my medical decision making (see chart for details).    Chest x-ray negative for pneumonia.  Patient with a history of recurrent bronchitis.  Non-smoker.  No wheezing here minimal improvement with a trial of an albuterol Atrovent nebulizer.  Put on a course of steroids.  First dose here.  And will continue with albuterol inhaler at home.  Work note provided.   Final Clinical Impressions(s) / ED Diagnoses   Final diagnoses:  Bronchitis    ED Discharge Orders        Ordered    albuterol (PROVENTIL HFA;VENTOLIN HFA) 108 (90 Base) MCG/ACT inhaler  Every 6 hours PRN     10/29/17 1102    predniSONE (DELTASONE) 10 MG tablet  Daily     10/29/17 1102       Vanetta Mulders, MD 10/29/17 1122

## 2017-11-02 ENCOUNTER — Encounter (HOSPITAL_BASED_OUTPATIENT_CLINIC_OR_DEPARTMENT_OTHER): Payer: Self-pay

## 2017-11-02 ENCOUNTER — Other Ambulatory Visit: Payer: Self-pay

## 2017-11-02 ENCOUNTER — Emergency Department (HOSPITAL_BASED_OUTPATIENT_CLINIC_OR_DEPARTMENT_OTHER)
Admission: EM | Admit: 2017-11-02 | Discharge: 2017-11-02 | Disposition: A | Discharge status: Home / Self Care | Payer: Self-pay | Attending: Emergency Medicine | Admitting: Emergency Medicine

## 2017-11-02 DIAGNOSIS — J3489 Other specified disorders of nose and nasal sinuses: Secondary | ICD-10-CM | POA: Insufficient documentation

## 2017-11-02 DIAGNOSIS — J069 Acute upper respiratory infection, unspecified: Secondary | ICD-10-CM | POA: Insufficient documentation

## 2017-11-02 DIAGNOSIS — B9789 Other viral agents as the cause of diseases classified elsewhere: Secondary | ICD-10-CM | POA: Insufficient documentation

## 2017-11-02 DIAGNOSIS — R0981 Nasal congestion: Secondary | ICD-10-CM | POA: Insufficient documentation

## 2017-11-02 MED ORDER — IPRATROPIUM-ALBUTEROL 0.5-2.5 (3) MG/3ML IN SOLN
3.0000 mL | Freq: Four times a day (QID) | RESPIRATORY_TRACT | Status: DC
  Administered 2017-11-02: 3 mL via RESPIRATORY_TRACT
  Filled 2017-11-02: qty 3

## 2017-11-02 NOTE — Discharge Instructions (Signed)
Please read attached information. If you experience any new or worsening signs or symptoms please return to the emergency room for evaluation. Please follow-up with your primary care provider or specialist as discussed. Please use medication prescribed only as directed and discontinue taking if you have any concerning signs or symptoms.   °

## 2017-11-02 NOTE — ED Triage Notes (Addendum)
Pt states she was seen here 1/19-dx bronchitis-states she feels no better-presents to triage in w/c however she drove self to ED-NAD-constant cough in triage

## 2017-11-02 NOTE — ED Provider Notes (Signed)
[facility] EMERGENCY DEPARTMENT Provider Note   CSN: [id number] Arrival date & time: 11/02/17  1122     History   Chief Complaint Chief Complaint  Patient presents with  . Bronchitis    HPI Kathryn Erickson is a 37 y.o. female.  HPI   37 year old female presents today with complaints of upper respiratory infection.  Patient notes symptoms started approximately 5 days ago with cough congestion feeling of wheezing.  She was seen in the emergency room 4 days ago and diagnosed with bronchitis.  At the time she had a negative chest x-ray.  She notes she continues to cough, rhinorrhea congestion.  She denies any fevers, denies any shortness of breath other than when she is coughing.  She is not a smoker has no history of asthma or COPD.  Patient was discharged home with cough medication, albuterol, and steroids which have not been improving her symptoms.     Past Medical History:  Diagnosis Date  . Allergy     There are no active problems to display for this patient.   History reviewed. No pertinent surgical history.  OB History    Gravida Para Term Preterm AB Living   3       3     SAB TAB Ectopic Multiple Live Births   3               Home Medications    Prior to Admission medications   Medication Sig Start Date End Date Taking? Authorizing Provider  albuterol (PROVENTIL HFA;VENTOLIN HFA) 108 (90 Base) MCG/ACT inhaler Inhale 1-2 puffs into the lungs every 6 (six) hours as needed for wheezing or shortness of breath. 10/29/17   Vanetta MuldersZackowski, Scott, MD  benzonatate (TESSALON) 100 MG capsule Take 1 capsule (100 mg total) by mouth every 8 (eight) hours. 10/01/17   Kellie ShropshireShrosbree, Emily J, PA-C  predniSONE (DELTASONE) 10 MG tablet Take 4 tablets (40 mg total) by mouth daily. 10/29/17   Vanetta MuldersZackowski, Scott, MD    Family History Family History  Problem Relation Age of Onset  . Lupus Maternal Grandmother   . Heart disease Paternal Grandmother   . Heart disease Paternal  Grandfather     Social History Social History   Tobacco Use  . Smoking status: Never Smoker  . Smokeless tobacco: Never Used  Substance Use Topics  . Alcohol use: No  . Drug use: No     Allergies   Amoxicillin and Cephalexin  Review of Systems Review of Systems  All other systems reviewed and are negative.  Physical Exam Updated Vital Signs BP 132/82 (BP Location: Right Arm)   Pulse 87   Temp 98.8 F (37.1 C) (Oral)   Resp 20   Ht 5\' 1"  (1.549 m)   Wt 76.2 kg (168 lb)   LMP 10/17/2017   SpO2 98%   BMI 31.74 kg/m   Physical Exam  Constitutional: She is oriented to person, place, and time. She appears well-developed and well-nourished.  HENT:  Head: Normocephalic and atraumatic.  Mouth/Throat: Uvula is midline, oropharynx is clear and moist and mucous membranes are normal. No oral lesions. No trismus in the jaw. No uvula swelling. No oropharyngeal exudate, posterior oropharyngeal edema, posterior oropharyngeal erythema or tonsillar abscesses. Tonsils are 0 on the right. Tonsils are 0 on the left. No tonsillar exudate.  Eyes: Conjunctivae are normal. Pupils are equal, round, and reactive to light. Right eye exhibits no discharge. Left eye exhibits no discharge. No scleral icterus.  Neck: Normal  range of motion. No JVD present. No tracheal deviation present.  Cardiovascular: Normal rate, regular rhythm and normal heart sounds.  Pulmonary/Chest: Effort normal and breath sounds normal. No stridor. No respiratory distress. She has no wheezes. She has no rales. She exhibits no tenderness.  Neurological: She is alert and oriented to person, place, and time. Coordination normal.  Psychiatric: She has a normal mood and affect. Her behavior is normal. Judgment and thought content normal.  Nursing note and vitals reviewed.    ED Treatments / Results  Labs (all labs ordered are listed, but only abnormal results are displayed) Labs Reviewed - No data to display  EKG  EKG  Interpretation None       Radiology No results found.  Procedures Procedures (including critical care time)  Medications Ordered in ED Medications  ipratropium-albuterol (DUONEB) 0.5-2.5 (3) MG/3ML nebulizer solution 3 mL (3 mLs Nebulization Given 11/02/17 1157)     Initial Impression / Assessment and Plan / ED Course  I have reviewed the triage vital signs and the nursing notes.  Pertinent labs & imaging results that were available during my care of the patient were reviewed by me and considered in my medical decision making (see chart for details).     Final Clinical Impressions(s) / ED Diagnoses   Final diagnoses:  Viral URI with cough   Labs:   Imaging:  Consults:  Therapeutics: DuoNeb  Discharge Meds:   Assessment/Plan: 37 year old female presents today with upper respiratory infection.  Patient is still within a reasonable timeframe to see him this is viral in nature.  She has a reassuring physical exam, reassuring vital signs, no signs of bacterial infection.  Patient given a breathing treatment here she appeared to be diminished, she has clear lung sounds no acute wheeze, no crackles.  Patient had recent chest x-ray without acute findings, given no significant change in her clinical picture no need for reassessment with chest x-ray.  Patient be given a work note, continue previous scribe medications.  She is given strict return precautions and follow-up information.  She verbalized understanding and agreement to today's plan had no further questions or concerns at the time of discharge.   ED Discharge Orders    None       Rosalio Loud 11/02/17 1258    Jacalyn Lefevre, MD 11/02/17 [phone]

## 2019-02-17 ENCOUNTER — Encounter (HOSPITAL_COMMUNITY): Payer: Self-pay | Admitting: Emergency Medicine

## 2019-02-17 ENCOUNTER — Emergency Department (HOSPITAL_COMMUNITY)
Admission: EM | Admit: 2019-02-17 | Discharge: 2019-02-18 | Disposition: A | Discharge status: Home / Self Care | Payer: Self-pay | Attending: Emergency Medicine | Admitting: Emergency Medicine

## 2019-02-17 ENCOUNTER — Emergency Department (HOSPITAL_COMMUNITY): Payer: Self-pay

## 2019-02-17 ENCOUNTER — Other Ambulatory Visit: Payer: Self-pay

## 2019-02-17 DIAGNOSIS — N201 Calculus of ureter: Secondary | ICD-10-CM | POA: Insufficient documentation

## 2019-02-17 DIAGNOSIS — Z79899 Other long term (current) drug therapy: Secondary | ICD-10-CM | POA: Insufficient documentation

## 2019-02-17 LAB — URINALYSIS, ROUTINE W REFLEX MICROSCOPIC
Bilirubin Urine: NEGATIVE
Glucose, UA: NEGATIVE mg/dL
Ketones, ur: 5 mg/dL — AB
Leukocytes,Ua: NEGATIVE
Nitrite: NEGATIVE
Protein, ur: NEGATIVE mg/dL
RBC / HPF: >50 RBC/hpf — ABNORMAL HIGH (ref 0–5)
Specific Gravity, Urine: 1.013 (ref 1.005–1.030)
pH: 6 (ref 5.0–8.0)

## 2019-02-17 LAB — CBC WITH DIFFERENTIAL/PLATELET
Abs Immature Granulocytes: 0.08 10*3/uL — ABNORMAL HIGH (ref 0.00–0.07)
Basophils Absolute: 0 10*3/uL (ref 0.0–0.1)
Basophils Relative: 0 %
Eosinophils Absolute: 0 10*3/uL (ref 0.0–0.5)
Eosinophils Relative: 0 %
HCT: 42.3 % (ref 36.0–46.0)
Hemoglobin: 14 g/dL (ref 12.0–15.0)
Immature Granulocytes: 1 %
Lymphocytes Relative: 7 %
Lymphs Abs: 1.1 10*3/uL (ref 0.7–4.0)
MCH: 30.9 pg (ref 26.0–34.0)
MCHC: 33.1 g/dL (ref 30.0–36.0)
MCV: 93.4 fL (ref 80.0–100.0)
Monocytes Absolute: 0.7 10*3/uL (ref 0.1–1.0)
Monocytes Relative: 4 %
Neutro Abs: 14.7 10*3/uL — ABNORMAL HIGH (ref 1.7–7.7)
Neutrophils Relative %: 88 %
Platelets: 393 10*3/uL (ref 150–400)
RBC: 4.53 MIL/uL (ref 3.87–5.11)
RDW: 12.9 % (ref 11.5–15.5)
WBC: 16.7 10*3/uL — ABNORMAL HIGH (ref 4.0–10.5)
nRBC: 0 % (ref 0.0–0.2)

## 2019-02-17 LAB — BASIC METABOLIC PANEL
Anion gap: 13 (ref 5–15)
BUN: 15 mg/dL (ref 6–20)
CO2: 21 mmol/L — ABNORMAL LOW (ref 22–32)
Calcium: 9.6 mg/dL (ref 8.9–10.3)
Chloride: 104 mmol/L (ref 98–111)
Creatinine, Ser: 0.96 mg/dL (ref 0.44–1.00)
GFR calc Af Amer: >60 mL/min (ref >60)
GFR calc non Af Amer: >60 mL/min (ref >60)
Glucose, Bld: 121 mg/dL — ABNORMAL HIGH (ref 70–99)
Potassium: 3.5 mmol/L (ref 3.5–5.1)
Sodium: 138 mmol/L (ref 135–145)

## 2019-02-17 LAB — I-STAT BETA HCG BLOOD, ED (MC, WL, AP ONLY): I-stat hCG, quantitative: <5 m[IU]/mL (ref <5)

## 2019-02-17 MED ORDER — KETOROLAC TROMETHAMINE 15 MG/ML IJ SOLN
15.0000 mg | Freq: Once | INTRAMUSCULAR | Status: AC
  Administered 2019-02-17: 22:00:00 15 mg via INTRAVENOUS
  Filled 2019-02-17: qty 1

## 2019-02-17 MED ORDER — OXYCODONE-ACETAMINOPHEN 5-325 MG PO TABS: 1.0000 | ORAL_TABLET | Freq: Four times a day (QID) | ORAL | 0 refills | Status: DC | PRN

## 2019-02-17 MED ORDER — ONDANSETRON HCL 4 MG/2ML IJ SOLN
4.0000 mg | Freq: Once | INTRAMUSCULAR | Status: AC
  Administered 2019-02-17: 4 mg via INTRAVENOUS
  Filled 2019-02-17: qty 2

## 2019-02-17 MED ORDER — ONDANSETRON HCL 4 MG PO TABS: 4.0000 mg | ORAL_TABLET | Freq: Four times a day (QID) | ORAL | 0 refills | Status: DC

## 2019-02-17 MED ORDER — HYDROMORPHONE HCL 1 MG/ML IJ SOLN
1.0000 mg | Freq: Once | INTRAMUSCULAR | Status: AC
  Administered 2019-02-17: 22:00:00 1 mg via INTRAVENOUS
  Filled 2019-02-17: qty 1

## 2019-02-17 NOTE — ED Notes (Signed)
Urine culture sent to the lab. 

## 2019-02-17 NOTE — Discharge Instructions (Addendum)
Your CT shows a 2-3 mm kidney stone. You should be able to pass this by yourself. Take pain medicine as needed. Take 600 mg ibuprofen every 6 hours as needed for pain in addition to percocet. Return to ER for uncontrolled pain, nausea or fever. Follow-up with urology if you have persistent symptoms beyond a few days.

## 2019-02-17 NOTE — ED Triage Notes (Signed)
Pt presents by [facility] for evaluation of LLQ abdominal pain and had positive pregnancy test at home. Pt has hx of miscarriage around 5-6 week mark. Pt reports being appx 5-[redacted] weeks pregnant at this time. Pt reported nausea without vomiting. Pt reports last miscarriage around 1.5 years ago.

## 2019-02-17 NOTE — ED Provider Notes (Signed)
[facility]-EMERGENCY DEPT Provider Note   CSN: [id number] Arrival date & time: 02/17/19  2009    History   Chief Complaint Chief Complaint  Patient presents with  . Abdominal Pain    HPI Kathryn Erickson is a 38 y.o. female.     HPI   37yF with LLQ pain. Acute onset earlier today. LLQ to L flank. Constant. Occasional sharper pain w/o appreciable exacerbating or relieving factors. Nausea. No vomiting. No urinary complaints. No unusual vaginal bleeding or discharge.   Past Medical History:  Diagnosis Date  . Allergy     There are no active problems to display for this patient.   History reviewed. No pertinent surgical history.   OB History    Gravida  5   Para      Term      Preterm      AB  3   Living        SAB  3   TAB      Ectopic      Multiple      Live Births               Home Medications    Prior to Admission medications   Medication Sig Start Date End Date Taking? Authorizing Provider  albuterol (PROVENTIL HFA;VENTOLIN HFA) 108 (90 Base) MCG/ACT inhaler Inhale 1-2 puffs into the lungs every 6 (six) hours as needed for wheezing or shortness of breath. 10/29/17   Vanetta Mulders, MD  benzonatate (TESSALON) 100 MG capsule Take 1 capsule (100 mg total) by mouth every 8 (eight) hours. 10/01/17   Kellie Shropshire, PA-C  predniSONE (DELTASONE) 10 MG tablet Take 4 tablets (40 mg total) by mouth daily. 10/29/17   Vanetta Mulders, MD    Family History Family History  Problem Relation Age of Onset  . Lupus Maternal Grandmother   . Heart disease Paternal Grandmother   . Heart disease Paternal Grandfather     Social History Social History   Tobacco Use  . Smoking status: Never Smoker  . Smokeless tobacco: Never Used  Substance Use Topics  . Alcohol use: No  . Drug use: No     Allergies   Amoxicillin and Cephalexin   Review of Systems Review of Systems  All systems reviewed and negative, other than as noted  in HPI.  Physical Exam Updated Vital Signs BP 123/78 (BP Location: Left Arm)   Pulse 86   Temp 98.3 F (36.8 C) (Oral)   Resp 18   Ht  (1.549 m)   Wt 85.7 kg   SpO2 100%   BMI 35.71 kg/m   Physical Exam Vitals signs and nursing note reviewed.  Constitutional:      General: She is not in acute distress.    Appearance: She is well-developed.  HENT:     Head: Normocephalic and atraumatic.  Eyes:     General:        Right eye: No discharge.        Left eye: No discharge.     Conjunctiva/sclera: Conjunctivae normal.  Neck:     Musculoskeletal: Neck supple.  Cardiovascular:     Rate and Rhythm: Normal rate and regular rhythm.     Heart sounds: Normal heart sounds. No murmur. No friction rub. No gallop.   Pulmonary:     Effort: Pulmonary effort is normal. No respiratory distress.     Breath sounds: Normal breath sounds.  Abdominal:     General:  There is no distension.     Palpations: Abdomen is soft.     Tenderness: There is abdominal tenderness.     Comments: LLQ/L flank tenderness w/o rebound or guarding.   Musculoskeletal:        General: No tenderness.  Skin:    General: Skin is warm and dry.  Neurological:     Mental Status: She is alert.  Psychiatric:        Behavior: Behavior normal.        Thought Content: Thought content normal.      ED Treatments / Results  Labs (all labs ordered are listed, but only abnormal results are displayed) Labs Reviewed  URINALYSIS, ROUTINE W REFLEX MICROSCOPIC - Abnormal; Notable for the following components:      Result Value   APPearance HAZY (*)    Hgb urine dipstick LARGE (*)    Ketones, ur 5 (*)    RBC / HPF >50 (*)    Bacteria, UA RARE (*)    All other components within normal limits  CBC WITH DIFFERENTIAL/PLATELET - Abnormal; Notable for the following components:   WBC 16.7 (*)    Neutro Abs 14.7 (*)    Abs Immature Granulocytes 0.08 (*)    All other components within normal limits  BASIC METABOLIC PANEL -  Abnormal; Notable for the following components:   CO2 21 (*)    Glucose, Bld 121 (*)    All other components within normal limits  I-STAT BETA HCG BLOOD, ED ([facility], [facility], [facility] ONLY)    EKG None  Radiology Ct Renal Stone Study  Result Date: 02/17/2019 CLINICAL DATA:  Flank pain EXAM: CT ABDOMEN AND PELVIS WITHOUT CONTRAST TECHNIQUE: Multidetector CT imaging of the abdomen and pelvis was performed following the standard protocol without IV contrast. COMPARISON:  None. FINDINGS: Lower chest: No acute abnormality. Hepatobiliary: No focal liver abnormality is seen. No gallstones, gallbladder wall thickening, or biliary dilatation. Pancreas: Unremarkable. No pancreatic ductal dilatation or surrounding inflammatory changes. Spleen: Normal in size without focal abnormality. Adrenals/Urinary Tract: Adrenal glands are within normal limits. Kidneys are well visualized bilaterally. No renal calculi are seen. Mild left hydronephrosis is noted which extends to the left UVJ. A small 2-3 mm stone is noted at the left UVJ causing the obstructive change. The bladder is decompressed. Stomach/Bowel: The appendix is within normal limits. Large and small bowel show no obstructive or inflammatory changes. Stomach is within normal limits. Vascular/Lymphatic: No significant vascular findings are present. No enlarged abdominal or pelvic lymph nodes. Reproductive: Uterus and bilateral adnexa are unremarkable. Other: No abdominal wall hernia or abnormality. No abdominopelvic ascites. Musculoskeletal: No acute or significant osseous findings. IMPRESSION: 2-3 mm left UVJ stone with mild hydronephrosis. Electronically Signed   By: Alcide Clever M.D.   On: 02/17/2019 22:17    Procedures Procedures (including critical care time)  Medications Ordered in ED Medications - No data to display   Initial Impression / Assessment and Plan / ED Course  I have reviewed the triage vital signs and the nursing notes.  Pertinent labs & imaging  results that were available during my care of the patient were reviewed by me and considered in my medical decision making (see chart for details).       37yF with L abdominal pain. Imaging significant for L ureteral stone. Afebrile. Symptoms now well controlled. Plan expectant management. Return precautions discussed. Outpt FU with urology as needed otherwise.   Final Clinical Impressions(s) / ED Diagnoses   Final  diagnoses:  Left ureteral stone    ED Discharge Orders    None       Raeford RazorKohut, Sher Shampine, MD 02/18/19 [phone]

## 2019-06-15 IMAGING — CT CT RENAL STONE PROTOCOL
2 of 4 series · 17 of 46 positions shown, 19 images · non-contrast
Comparison: None.

CLINICAL DATA: Flank pain

EXAM:
CT ABDOMEN AND PELVIS WITHOUT CONTRAST
TECHNIQUE: Multidetector CT imaging of the abdomen and pelvis was performed
following the standard protocol without IV contrast.

[Series 3: axial st · axial · 0.96mm/px · z∈[-539,-144]mm · 14 of 89 slices shown, 16 images]
[im 5/89  soft-tissue]
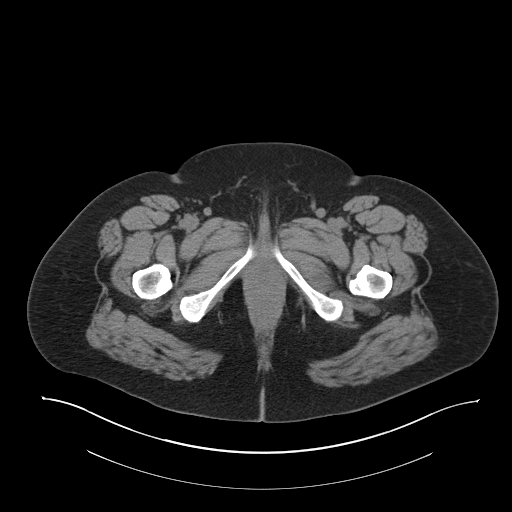
[im 5/89  bone]
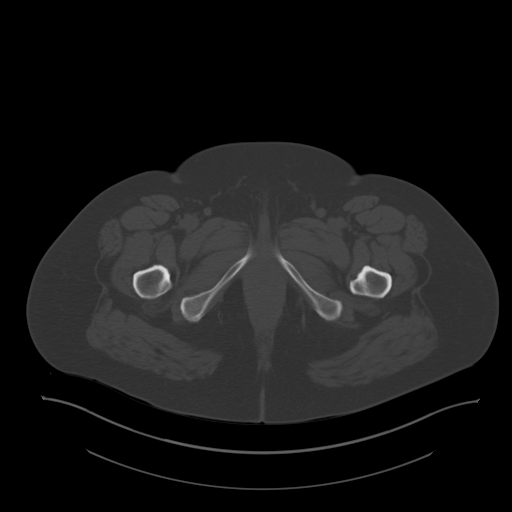
[im 10/89  soft-tissue]
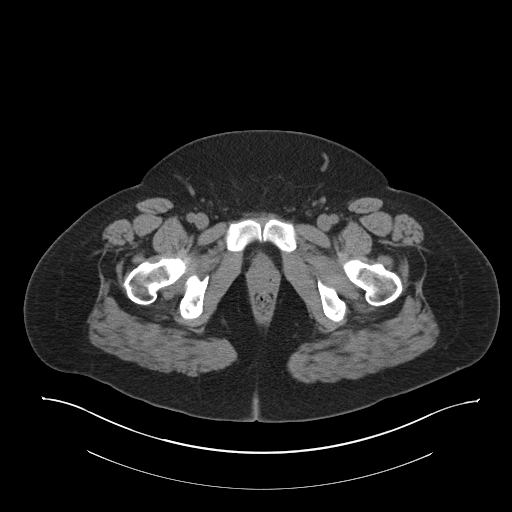
[im 20/89  soft-tissue]
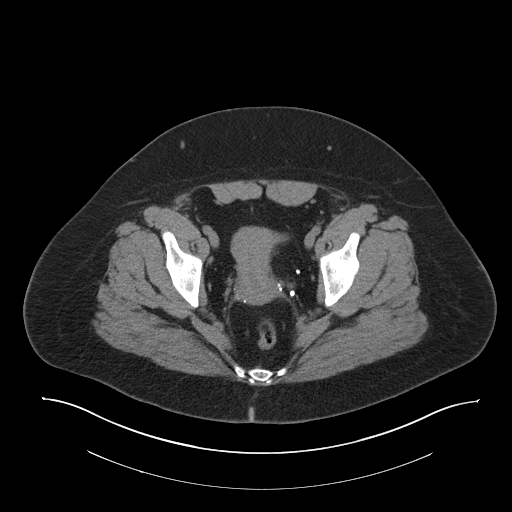
[im 25/89  soft-tissue]
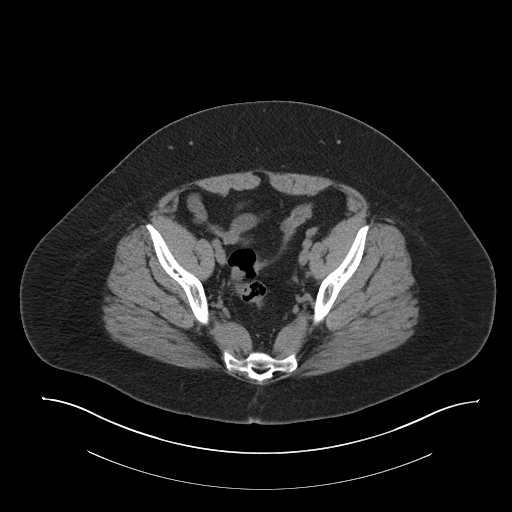
[im 30/89  soft-tissue]
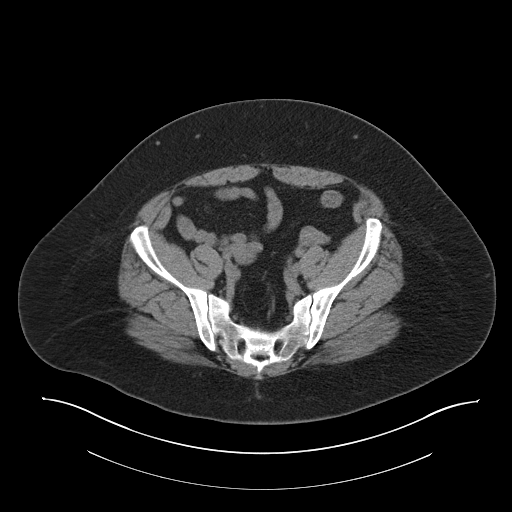
[im 35/89  soft-tissue]
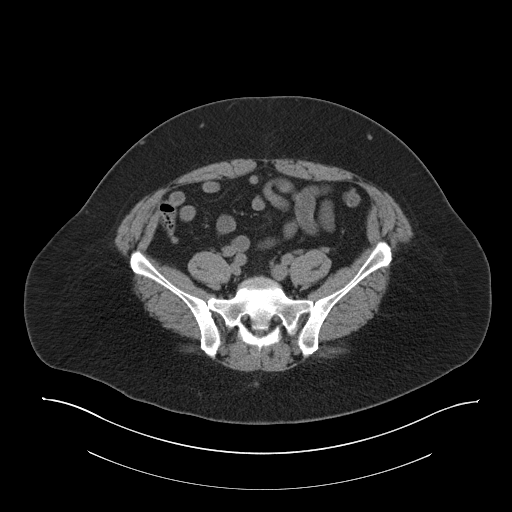
[im 40/89  soft-tissue]
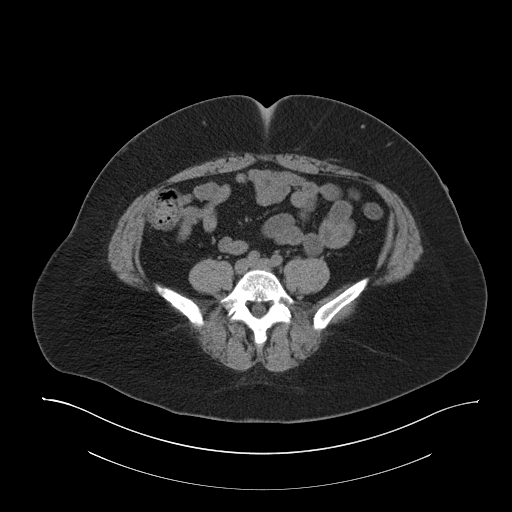
[im 49/89  soft-tissue]
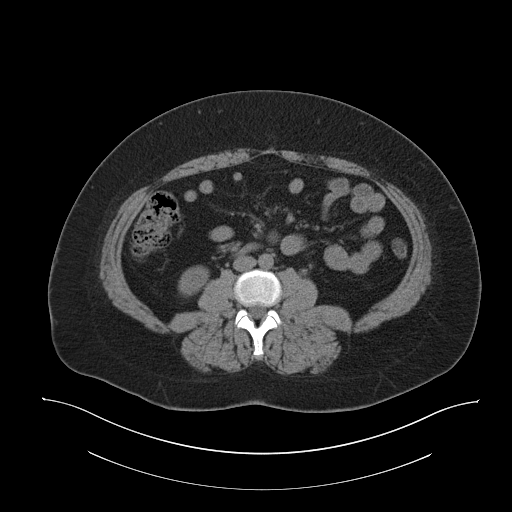
[im 54/89  soft-tissue]
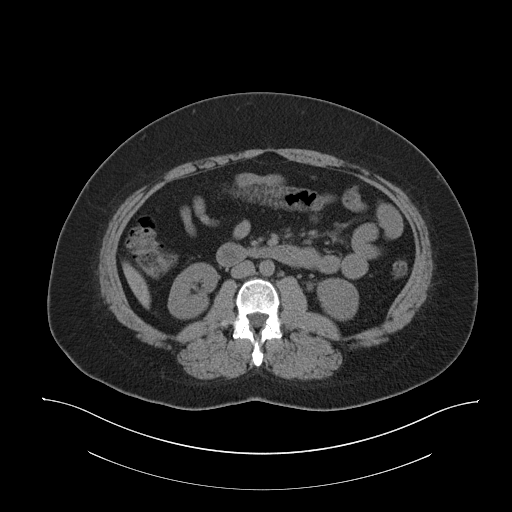
[im 54/89  bone]
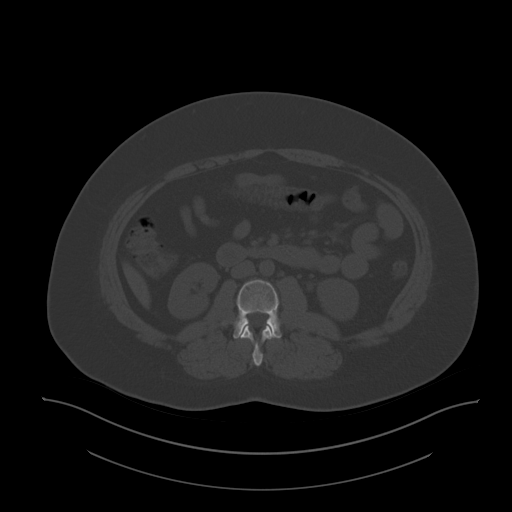
[im 59/89  soft-tissue]
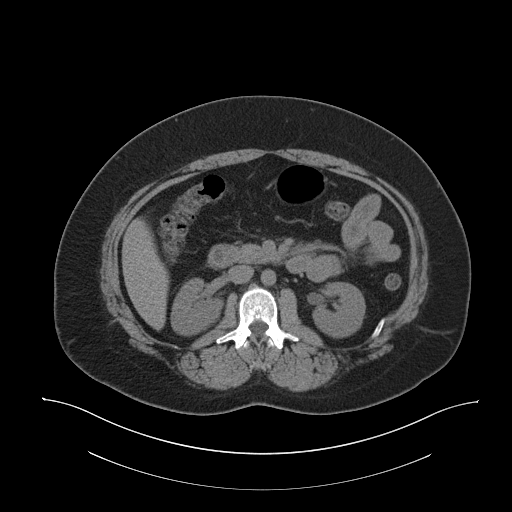
[im 64/89  soft-tissue]
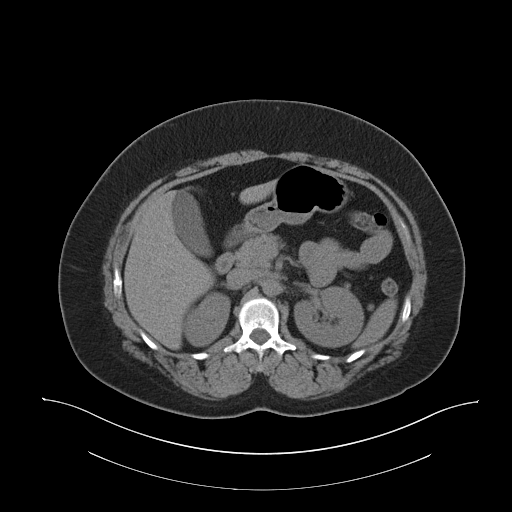
[im 69/89  soft-tissue]
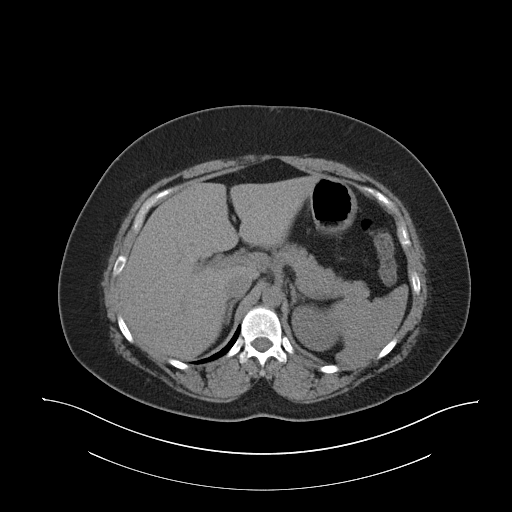
[im 79/89  soft-tissue]
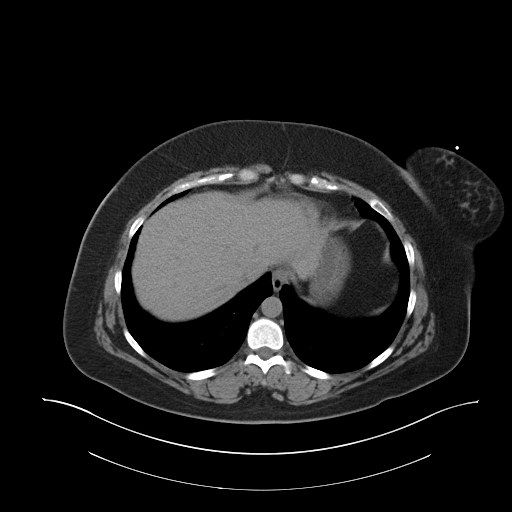
[im 84/89  soft-tissue]
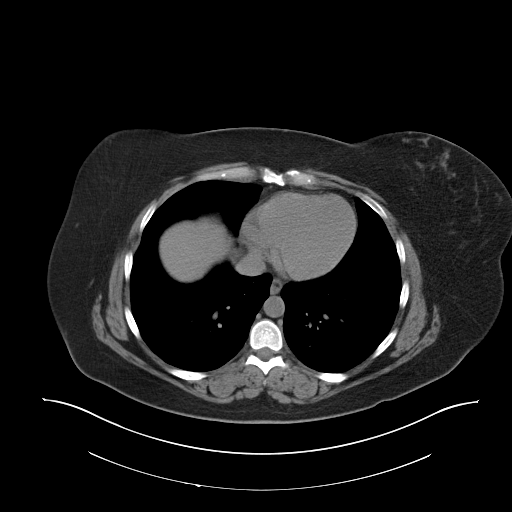

[Series 5: coronal · coronal · 0.87mm/px · 3 of 172 slices shown]
[im 58/172  soft-tissue]
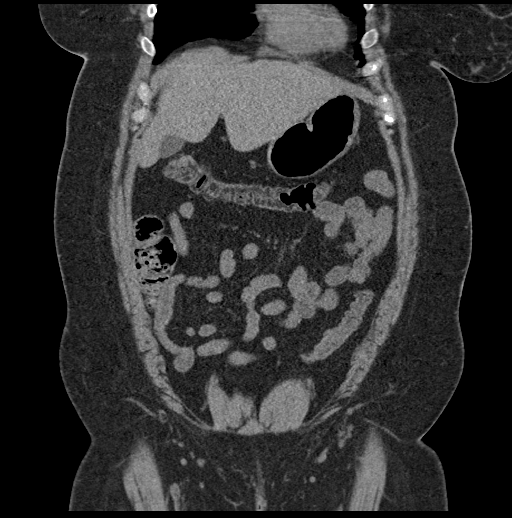
[im 77/172  soft-tissue]
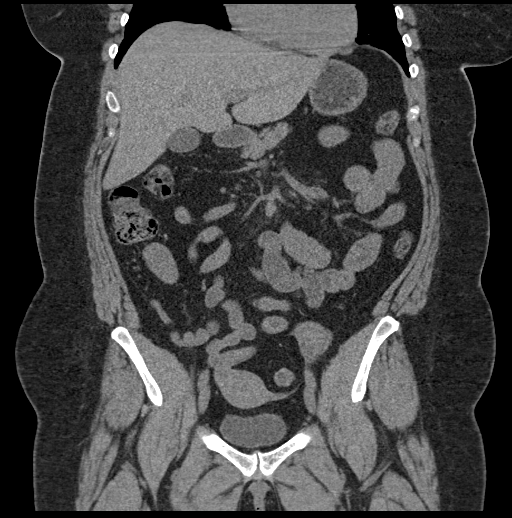
[im 96/172  soft-tissue]
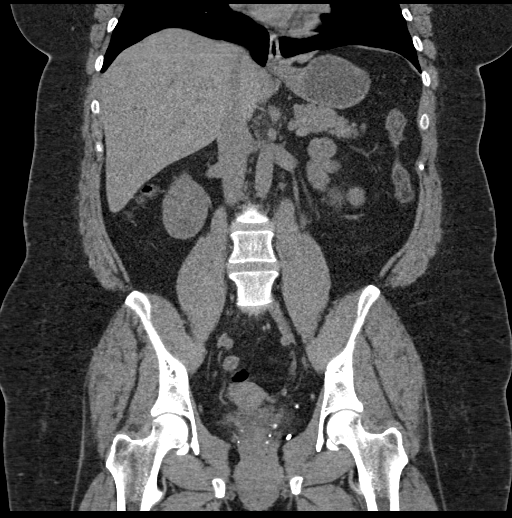

[17 of 46 positions shown; findings below may reference images not displayed]

FINDINGS: Lower chest: No acute abnormality.

Hepatobiliary: No focal liver abnormality is seen. No gallstones,
gallbladder wall thickening, or biliary dilatation.

Pancreas: Unremarkable. No pancreatic ductal dilatation or
surrounding inflammatory changes.

Spleen: Normal in size without focal abnormality.

Adrenals/Urinary Tract: Adrenal glands are within normal limits.
Kidneys are well visualized bilaterally. No renal calculi are seen.
Mild left hydronephrosis is noted which extends to the left UVJ. A
small 2-3 mm stone is noted at the left UVJ causing the obstructive
change. The bladder is decompressed.

Stomach/Bowel: The appendix is within normal limits. Large and small
bowel show no obstructive or inflammatory changes. Stomach is within
normal limits.

Vascular/Lymphatic: No significant vascular findings are present. No
enlarged abdominal or pelvic lymph nodes.

Reproductive: Uterus and bilateral adnexa are unremarkable.

Other: No abdominal wall hernia or abnormality. No abdominopelvic
ascites.

Musculoskeletal: No acute or significant osseous findings.
IMPRESSION: 2-3 mm left UVJ stone with mild hydronephrosis.

## 2021-03-05 ENCOUNTER — Ambulatory Visit: Payer: BC Managed Care – PPO | Attending: Internal Medicine

## 2021-03-05 ENCOUNTER — Emergency Department (HOSPITAL_BASED_OUTPATIENT_CLINIC_OR_DEPARTMENT_OTHER)
Admission: EM | Admit: 2021-03-05 | Discharge: 2021-03-05 | Disposition: A | Discharge status: Home / Self Care | Payer: BC Managed Care – PPO | Attending: Emergency Medicine | Admitting: Emergency Medicine

## 2021-03-05 ENCOUNTER — Encounter (HOSPITAL_BASED_OUTPATIENT_CLINIC_OR_DEPARTMENT_OTHER): Payer: Self-pay | Admitting: Emergency Medicine

## 2021-03-05 ENCOUNTER — Other Ambulatory Visit (HOSPITAL_BASED_OUTPATIENT_CLINIC_OR_DEPARTMENT_OTHER): Payer: Self-pay

## 2021-03-05 ENCOUNTER — Other Ambulatory Visit: Payer: Self-pay

## 2021-03-05 DIAGNOSIS — Z20822 Contact with and (suspected) exposure to covid-19: Secondary | ICD-10-CM | POA: Insufficient documentation

## 2021-03-05 DIAGNOSIS — Z23 Encounter for immunization: Secondary | ICD-10-CM

## 2021-03-05 DIAGNOSIS — R42 Dizziness and giddiness: Secondary | ICD-10-CM | POA: Insufficient documentation

## 2021-03-05 DIAGNOSIS — R5383 Other fatigue: Secondary | ICD-10-CM | POA: Insufficient documentation

## 2021-03-05 DIAGNOSIS — J029 Acute pharyngitis, unspecified: Secondary | ICD-10-CM | POA: Diagnosis not present

## 2021-03-05 DIAGNOSIS — R112 Nausea with vomiting, unspecified: Secondary | ICD-10-CM | POA: Insufficient documentation

## 2021-03-05 LAB — CBC WITH DIFFERENTIAL/PLATELET
Abs Immature Granulocytes: 0.02 10*3/uL (ref 0.00–0.07)
Basophils Absolute: 0 10*3/uL (ref 0.0–0.1)
Basophils Relative: 0 %
Eosinophils Absolute: 0.1 10*3/uL (ref 0.0–0.5)
Eosinophils Relative: 1 %
HCT: 40.1 % (ref 36.0–46.0)
Hemoglobin: 13.3 g/dL (ref 12.0–15.0)
Immature Granulocytes: 0 %
Lymphocytes Relative: 19 %
Lymphs Abs: 1.7 10*3/uL (ref 0.7–4.0)
MCH: 30.2 pg (ref 26.0–34.0)
MCHC: 33.2 g/dL (ref 30.0–36.0)
MCV: 90.9 fL (ref 80.0–100.0)
Monocytes Absolute: 0.5 10*3/uL (ref 0.1–1.0)
Monocytes Relative: 6 %
Neutro Abs: 6.6 10*3/uL (ref 1.7–7.7)
Neutrophils Relative %: 74 %
Platelets: 341 10*3/uL (ref 150–400)
RBC: 4.41 MIL/uL (ref 3.87–5.11)
RDW: 13 % (ref 11.5–15.5)
WBC: 9 10*3/uL (ref 4.0–10.5)
nRBC: 0 % (ref 0.0–0.2)

## 2021-03-05 LAB — COMPREHENSIVE METABOLIC PANEL
ALT: 26 U/L (ref 0–44)
AST: 21 U/L (ref 15–41)
Albumin: 3.9 g/dL (ref 3.5–5.0)
Alkaline Phosphatase: 57 U/L (ref 38–126)
Anion gap: 8 (ref 5–15)
BUN: 13 mg/dL (ref 6–20)
CO2: 27 mmol/L (ref 22–32)
Calcium: 9 mg/dL (ref 8.9–10.3)
Chloride: 103 mmol/L (ref 98–111)
Creatinine, Ser: 0.67 mg/dL (ref 0.44–1.00)
GFR, Estimated: >60 mL/min (ref >60)
Glucose, Bld: 121 mg/dL — ABNORMAL HIGH (ref 70–99)
Potassium: 4.4 mmol/L (ref 3.5–5.1)
Sodium: 138 mmol/L (ref 135–145)
Total Bilirubin: 0.2 mg/dL — ABNORMAL LOW (ref 0.3–1.2)
Total Protein: 7.4 g/dL (ref 6.5–8.1)

## 2021-03-05 LAB — URINALYSIS, ROUTINE W REFLEX MICROSCOPIC
Bilirubin Urine: NEGATIVE
Glucose, UA: NEGATIVE mg/dL
Hgb urine dipstick: NEGATIVE
Ketones, ur: NEGATIVE mg/dL
Leukocytes,Ua: NEGATIVE
Nitrite: NEGATIVE
Protein, ur: NEGATIVE mg/dL
Specific Gravity, Urine: 1.015 (ref 1.005–1.030)
pH: 7.5 (ref 5.0–8.0)

## 2021-03-05 LAB — RESP PANEL BY RT-PCR (FLU A&B, COVID) ARPGX2
Influenza A by PCR: NEGATIVE
Influenza B by PCR: NEGATIVE
SARS Coronavirus 2 by RT PCR: NEGATIVE

## 2021-03-05 LAB — LIPASE, BLOOD: Lipase: 33 U/L (ref 11–51)

## 2021-03-05 LAB — PREGNANCY, URINE: Preg Test, Ur: NEGATIVE

## 2021-03-05 MED ORDER — ONDANSETRON HCL 4 MG/2ML IJ SOLN
4.0000 mg | Freq: Once | INTRAMUSCULAR | Status: AC
  Administered 2021-03-05: 4 mg via INTRAVENOUS
  Filled 2021-03-05: qty 2

## 2021-03-05 MED ORDER — SODIUM CHLORIDE 0.9 % IV BOLUS
500.0000 mL | Freq: Once | INTRAVENOUS | Status: AC
  Administered 2021-03-05: 500 mL via INTRAVENOUS

## 2021-03-05 MED ORDER — ONDANSETRON 4 MG PO TBDP
4.0000 mg | ORAL_TABLET | Freq: Three times a day (TID) | ORAL | 0 refills | Status: DC | PRN
  Filled 2021-03-05: qty 20, 7d supply, fill #0

## 2021-03-05 MED ORDER — DOXYCYCLINE HYCLATE 100 MG PO CAPS
100.0000 mg | ORAL_CAPSULE | Freq: Two times a day (BID) | ORAL | 0 refills | Status: AC
  Filled 2021-03-05: qty 14, 7d supply, fill #0

## 2021-03-05 NOTE — Progress Notes (Signed)
   Covid-19 Vaccination Clinic  Name:  Kathryn Erickson    MRN: [id number] DOB: 03-12-1981  03/05/2021  [name] was observed post Covid-19 immunization for 15 minutes without incident. She was provided with Vaccine Information Sheet and instruction to access the V-Safe system.   [name] was instructed to call 911 with any severe reactions post vaccine: Marland Kitchen Difficulty breathing  . Swelling of face and throat  . A fast heartbeat  . A bad rash all over body  . Dizziness and weakness   Immunizations Administered    Name Date Dose VIS Date Route   PFIZER Comrnaty(Gray TOP) Covid-19 Vaccine 03/05/2021 11:44 AM 0.3 mL 09/18/2020 Intramuscular   Manufacturer: [employer], [employer]   Lot: [id number]   NDC: [id number]

## 2021-03-05 NOTE — Discharge Instructions (Signed)
You were seen in the emergency department today with weakness along with nausea/vomiting, rash on the leg.  I am starting you on doxycycline in case this is from a tick.  Your lab work is reassuring.  Your COVID test is negative.  Please follow with your primary care doctor and return to the emergency department any new or suddenly worsening symptoms.

## 2021-03-05 NOTE — ED Triage Notes (Signed)
Patient complains of intermittent symptoms of dizziness, lightheadedness, nausea, vomiting. Has possible tick bite to posterior right calf.

## 2021-03-05 NOTE — ED Provider Notes (Signed)
Emergency Department Provider Note   I have reviewed the triage vital signs and the nursing notes.   HISTORY  Chief Complaint Weakness, nausea   HPI Kathryn Erickson is a 40 y.o. female presents to the emergency department with intermittent lightheadedness with nausea and vomiting.  She denies any chest pain, heart palpitations, shortness of breath.  Symptoms have been ongoing over the past couple of days.  She noticed a spot on the back of her calf which she thinks may have been a tick bite.  There has been no progressive rash in the area.  She did not see a tick or remove one from this area.  She states she does spend a lot of time outdoors.  She is not having abdominal pain, urinary tract infection symptoms, diarrhea/vomiting.  She has some mild sore throat but no nasal congestion, cough.  Denies any persistent headache.   Past Medical History:  Diagnosis Date  . Allergy     There are no problems to display for this patient.   History reviewed. No pertinent surgical history.  Allergies Amoxicillin and Cephalexin  Family History  Problem Relation Age of Onset  . Lupus Maternal Grandmother   . Heart disease Paternal Grandmother   . Heart disease Paternal Grandfather     Social History Social History   Tobacco Use  . Smoking status: Never Smoker  . Smokeless tobacco: Never Used  Substance Use Topics  . Alcohol use: No  . Drug use: No    Review of Systems   Constitutional: No fever/chills. Positive fatigue.  Eyes: No visual changes. ENT: Mild sore throat. Cardiovascular: Denies chest pain. Respiratory: Denies shortness of breath. Gastrointestinal: No abdominal pain. Positive nausea and vomiting.  No diarrhea.  No constipation. Genitourinary: Negative for dysuria. Musculoskeletal: Negative for back pain. Skin: Rash to right calf.  Neurological: Negative for focal weakness or numbness. Intermittent HA.   10-point ROS otherwise  negative.  ____________________________________________   PHYSICAL EXAM:  VITAL SIGNS: ED Triage Vitals  Enc Vitals Group     BP 03/05/21 0903 135/80     Pulse Rate 03/05/21 0903 78     Resp 03/05/21 0903 18     Temp 03/05/21 0903 98.5 F (36.9 C)     Temp Source 03/05/21 0903 Oral     SpO2 03/05/21 0903 100 %     Weight 03/05/21 0857 187 lb (84.8 kg)     Height 03/05/21 0857 5\' 1"  (1.549 m)   Constitutional: Alert and oriented. Well appearing and in no acute distress. Eyes: Conjunctivae are normal. Head: Atraumatic. Nose: No congestion/rhinnorhea. Mouth/Throat: Mucous membranes are moist.  Oropharynx non-erythematous. No PTA.  Neck: No stridor. No cervical spine tenderness to palpation. Cardiovascular: Normal rate, regular rhythm. Good peripheral circulation. Grossly normal heart sounds.   Respiratory: Normal respiratory effort.  No retractions. Lungs CTAB. Gastrointestinal: Soft and nontender. No distention.  Musculoskeletal: No lower extremity tenderness nor edema. No gross deformities of extremities. Neurologic:  Normal speech and language. No gross focal neurologic deficits are appreciated.  Skin:  Skin is warm, dry and intact. No rash noted.  ____________________________________________   LABS (all labs ordered are listed, but only abnormal results are displayed)  Labs Reviewed  COMPREHENSIVE METABOLIC PANEL - Abnormal; Notable for the following components:      Result Value   Glucose, Bld 121 (*)    Total Bilirubin 0.2 (*)    All other components within normal limits  URINALYSIS, ROUTINE W REFLEX MICROSCOPIC - Abnormal;  Notable for the following components:   APPearance CLOUDY (*)    All other components within normal limits  RESP PANEL BY RT-PCR (FLU A&B, COVID) ARPGX2  LIPASE, BLOOD  CBC WITH DIFFERENTIAL/PLATELET  PREGNANCY, URINE   ____________________________________________  RADIOLOGY  No results  found.  ____________________________________________   PROCEDURES  Procedure(s) performed:   Procedures  None  ____________________________________________   INITIAL IMPRESSION / ASSESSMENT AND PLAN / ED COURSE  Pertinent labs & imaging results that were available during my care of the patient were reviewed by me and considered in my medical decision making (see chart for details).   Patient presents to the emergency department with nonspecific fatigue, nausea/vomiting, intermittent headache.  She does have a small rash to the back of the right calf.  No erythema migrans.  No meningeal findings.  Plan for COVID/flu testing.  Abdomen is soft and nontender throughout.  Labs are pending.  UA also pending.  Labs reassuring. Will cover with Doxycycline with question on tick bite and symptoms. Plan for close PCP follow up.  ____________________________________________  FINAL CLINICAL IMPRESSION(S) / ED DIAGNOSES  Final diagnoses:  Non-intractable vomiting with nausea, unspecified vomiting type  Fatigue, unspecified type     MEDICATIONS GIVEN DURING THIS VISIT:  Medications  sodium chloride 0.9 % bolus 500 mL (0 mLs Intravenous Stopped 03/05/21 1045)  ondansetron (ZOFRAN) injection 4 mg (4 mg Intravenous Given 03/05/21 0938)     NEW OUTPATIENT MEDICATIONS STARTED DURING THIS VISIT:  Discharge Medication List as of 03/05/2021 11:02 AM    START taking these medications   Details  doxycycline (VIBRAMYCIN) 100 MG capsule Take 1 capsule (100 mg total) by mouth 2 (two) times daily for 7 days., Starting Thu 03/05/2021, Until Thu 03/12/2021, Normal    ondansetron (ZOFRAN ODT) 4 MG disintegrating tablet Take 1 tablet (4 mg total) by mouth every 8 (eight) hours as needed., Starting Thu 03/05/2021, Normal        Note:  This document was prepared using Dragon voice recognition software and may include unintentional dictation errors.  Alona Bene, MD, [facility]    Summit Borchardt,  Arlyss Repress, MD 03/07/21 [phone]

## 2021-03-13 ENCOUNTER — Other Ambulatory Visit (HOSPITAL_BASED_OUTPATIENT_CLINIC_OR_DEPARTMENT_OTHER): Payer: Self-pay

## 2021-03-13 MED ORDER — PFIZER-BIONT COVID-19 VAC-TRIS 30 MCG/0.3ML IM SUSP
INTRAMUSCULAR | 0 refills | Status: DC
  Filled 2021-03-13: qty 0.3, 1d supply, fill #0

## 2022-11-12 NOTE — Progress Notes (Unsigned)
HPI: [name] is a 42 y.o. female, who is here today with her husband to establish care.  She reports not having a primary care physician in several years . She has a history of allergies, asthma,GERD, and migraines.   She experiences migraines approximately once every couple of months, typically presenting as one-sided or around the top of her head. She reports sensitivity to light and loud noises during migraines and takes Excedrin Migraine for relief.  Asthma: She has been using her albuterol inhaler more frequently in the past couple of weeks, approximately three to four times a week. She experiences coughing spells followed by difficulty catching her breath and wheezing. She has tried other inhalers in the past but does not recall their effectiveness.  Occasional blood on tissue after defecation, which she has had intermittently for years and attributes to a recurring hemorrhoid.   She mentions a history of an abnormal pap smear. Last pap smear about 8-10 years ago.  Review of Systems  Constitutional:  Negative for activity change, appetite change and fever.  HENT:  Negative for mouth sores, nosebleeds and trouble swallowing.   Respiratory:  Negative for cough, shortness of breath and wheezing.   Cardiovascular:  Negative for chest pain, palpitations and leg swelling.  Gastrointestinal:  Negative for abdominal pain, nausea and vomiting.       Negative for changes in bowel habits.  Endocrine: Negative for cold intolerance and heat intolerance.  Genitourinary:  Negative for decreased urine volume, dysuria and hematuria.  Skin:  Negative for rash.  Allergic/Immunologic: Positive for environmental allergies.  Neurological:  Negative for syncope, facial asymmetry and weakness.  See other pertinent positives and negatives in HPI.  No current outpatient medications on file prior to visit.   No current facility-administered medications on file prior to visit.   Past Medical  History:  Diagnosis Date   Allergy    GERD (gastroesophageal reflux disease)    Allergies  Allergen Reactions   Amoxicillin Rash    Has patient had a PCN reaction causing immediate rash, facial/tongue/throat swelling, SOB or lightheadedness with hypotension: Yes Has patient had a PCN reaction causing severe rash involving mucus membranes or skin necrosis: Yes Has patient had a PCN reaction that required hospitalization: No Has patient had a PCN reaction occurring within the last 10 years: No If all of the above answers are "NO", then may proceed with Cephalosporin use.   Cephalexin Rash    Family History  Problem Relation Age of Onset   Lupus Maternal Grandmother    Heart disease Paternal Grandmother    Heart disease Paternal Grandfather    Social History   Socioeconomic History   Marital status: Married    Spouse name: Not on file   Number of children: Not on file   Years of education: Not on file   Highest education level: Not on file  Occupational History   Not on file  Tobacco Use   Smoking status: Never   Smokeless tobacco: Never  Substance and Sexual Activity   Alcohol use: No   Drug use: No   Sexual activity: Yes    Birth control/protection: None  Other Topics Concern   Not on file  Social History Narrative   Not on file   Social Determinants of Health   Financial Resource Strain: Not on file  Food Insecurity: Not on file  Transportation Needs: Not on file  Physical Activity: Not on file  Stress: Not on file  Social Connections: Not  on file   Vitals:   11/15/22 1000  BP: 120/76  Pulse: 100  Resp: 12  Temp: 98.8 F (37.1 C)  SpO2: 98%   Body mass index is 39.21 kg/m.  Physical Exam Vitals and nursing note reviewed.  Constitutional:      General: She is not in acute distress.    Appearance: She is well-developed.  HENT:     Head: Normocephalic and atraumatic.     Right Ear: Ear canal and external ear normal. Tympanic membrane is scarred.  Tympanic membrane is not erythematous.     Left Ear: Tympanic membrane, ear canal and external ear normal.     Nose:     Right Turbinates: Enlarged.     Left Turbinates: Enlarged.     Mouth/Throat:     Mouth: Mucous membranes are moist.     Pharynx: Oropharynx is clear.  Eyes:     Conjunctiva/sclera: Conjunctivae normal.  Cardiovascular:     Rate and Rhythm: Normal rate and regular rhythm.     Pulses:          Dorsalis pedis pulses are 2+ on the right side and 2+ on the left side.     Heart sounds: No murmur heard. Pulmonary:     Effort: Pulmonary effort is normal. No respiratory distress.     Breath sounds: Normal breath sounds.  Abdominal:     Palpations: Abdomen is soft. There is no hepatomegaly or mass.     Tenderness: There is no abdominal tenderness.  Lymphadenopathy:     Cervical: No cervical adenopathy.  Skin:    General: Skin is warm.     Findings: No erythema or rash.  Neurological:     General: No focal deficit present.     Mental Status: She is alert and oriented to person, place, and time.     Cranial Nerves: No cranial nerve deficit.     Gait: Gait normal.  Psychiatric:        Mood and Affect: Mood and affect normal.   ASSESSMENT AND PLAN: Kathryn Erickson was seen today for establish care.  Diagnoses and all orders for this visit: Lab Results  Component Value Date   CHOL 206 (H) 11/15/2022   HDL 65.60 11/15/2022   LDLCALC 131 (H) 11/15/2022   TRIG 48.0 11/15/2022   CHOLHDL 3 11/15/2022   Lab Results  Component Value Date   CREATININE 0.70 11/15/2022   BUN 10 11/15/2022   NA 138 11/15/2022   K 4.3 11/15/2022   CL 104 11/15/2022   CO2 28 11/15/2022   Lab Results  Component Value Date   HGBA1C 5.7 11/15/2022   Migraine without aura and without status migrainosus, not intractable Assessment & Plan: Problem is stable, not frequent episodes. Continue OTC Excedrin migraine daily as needed.   Mild intermittent asthma without complication Assessment &  Plan: Problem is not well controlled. Advair 100-50 mcg bid added today, instructed to swish after use. Continue Albuterol inh 2 puff q 4-6 hours prn. F/U in 3 months, before if needed.  Orders: -     Fluticasone-Salmeterol; Inhale 1 puff into the lungs 2 (two) times daily.  Dispense: 1 each; Refill: 3  Diabetes mellitus screening -     Basic metabolic panel; Future -     Hemoglobin A1c; Future  Lipid screening -     Lipid panel; Future  Encounter for HCV screening test for low risk patient -     Hepatitis C antibody; Future  Need for  Tdap vaccination -     Tdap vaccine greater than or equal to 7yo IM   Return in about 3 months (around 02/13/2023) for chronic problems.  Shaketta Rill G. [place], MD  [facility]. [place] office.

## 2022-11-15 ENCOUNTER — Encounter: Payer: Self-pay | Admitting: Family Medicine

## 2022-11-15 ENCOUNTER — Ambulatory Visit (INDEPENDENT_AMBULATORY_CARE_PROVIDER_SITE_OTHER): Payer: BC Managed Care – PPO | Admitting: Family Medicine

## 2022-11-15 VITALS — BP 120/76 | HR 100 | Temp 98.8°F | Resp 12 | Ht 61.0 in | Wt 207.5 lb

## 2022-11-15 DIAGNOSIS — Z13220 Encounter for screening for lipoid disorders: Secondary | ICD-10-CM

## 2022-11-15 DIAGNOSIS — Z131 Encounter for screening for diabetes mellitus: Secondary | ICD-10-CM

## 2022-11-15 DIAGNOSIS — G43009 Migraine without aura, not intractable, without status migrainosus: Secondary | ICD-10-CM | POA: Insufficient documentation

## 2022-11-15 DIAGNOSIS — Z23 Encounter for immunization: Secondary | ICD-10-CM | POA: Diagnosis not present

## 2022-11-15 DIAGNOSIS — Z1159 Encounter for screening for other viral diseases: Secondary | ICD-10-CM | POA: Diagnosis not present

## 2022-11-15 DIAGNOSIS — J4520 Mild intermittent asthma, uncomplicated: Secondary | ICD-10-CM

## 2022-11-15 LAB — BASIC METABOLIC PANEL
BUN: 10 mg/dL (ref 6–23)
CO2: 28 mEq/L (ref 19–32)
Calcium: 8.9 mg/dL (ref 8.4–10.5)
Chloride: 104 mEq/L (ref 96–112)
Creatinine, Ser: 0.7 mg/dL (ref 0.40–1.20)
GFR: 108.88 mL/min (ref >60.00)
Glucose, Bld: 98 mg/dL (ref 70–99)
Potassium: 4.3 mEq/L (ref 3.5–5.1)
Sodium: 138 mEq/L (ref 135–145)

## 2022-11-15 LAB — LIPID PANEL
Cholesterol: 206 mg/dL — ABNORMAL HIGH (ref 0–200)
HDL: 65.6 mg/dL (ref >39.00)
LDL Cholesterol: 131 mg/dL — ABNORMAL HIGH (ref 0–99)
NonHDL: 140.82
Total CHOL/HDL Ratio: 3
Triglycerides: 48 mg/dL (ref 0.0–149.0)
VLDL: 9.6 mg/dL (ref 0.0–40.0)

## 2022-11-15 LAB — HEMOGLOBIN A1C: Hgb A1c MFr Bld: 5.7 % (ref 4.6–6.5)

## 2022-11-15 MED ORDER — FLUTICASONE-SALMETEROL 100-50 MCG/ACT IN AEPB: 1.0000 | INHALATION_SPRAY | Freq: Two times a day (BID) | RESPIRATORY_TRACT | 3 refills | Status: DC

## 2022-11-15 NOTE — Patient Instructions (Addendum)
A few things to remember from today's visit:  Diabetes mellitus screening - Plan: Basic metabolic panel, Hemoglobin A1c  Lipid screening - Plan: Lipid panel  Encounter for HCV screening test for low risk patient - Plan: Hepatitis C antibody  Mild intermittent asthma without complication - Plan: fluticasone-salmeterol (ADVAIR) 100-50 MCG/ACT AEPB  Advair added today to use 2 times daily, rinse after use. Continue Albuterol inh 2 puff every 4-6 hours as needed.  If you need refills for medications you take chronically, please call your pharmacy. Do not use My Chart to request refills or for acute issues that need immediate attention. If you send a my chart message, it may take a few days to be addressed, specially if I am not in the office.  Please be sure medication list is accurate. If a new problem present, please set up appointment sooner than planned today.

## 2022-11-15 NOTE — Assessment & Plan Note (Signed)
Problem is stable, not frequent episodes. Continue OTC Excedrin migraine daily as needed.

## 2022-11-16 LAB — HEPATITIS C ANTIBODY: Hepatitis C Ab: NONREACTIVE

## 2022-11-18 NOTE — Assessment & Plan Note (Signed)
Problem is not well controlled. Advair 100-50 mcg bid added today, instructed to swish after use. Continue Albuterol inh 2 puff q 4-6 hours prn. F/U in 3 months, before if needed.

## 2023-02-18 ENCOUNTER — Ambulatory Visit: Payer: BC Managed Care – PPO | Admitting: Family Medicine

## 2023-10-12 ENCOUNTER — Ambulatory Visit: Payer: BC Managed Care – PPO

## 2023-10-18 ENCOUNTER — Ambulatory Visit: Payer: BC Managed Care – PPO | Admitting: Family Medicine

## 2023-11-01 ENCOUNTER — Telehealth: Payer: Self-pay

## 2023-11-01 ENCOUNTER — Other Ambulatory Visit (HOSPITAL_COMMUNITY): Payer: Self-pay

## 2023-11-01 NOTE — Telephone Encounter (Signed)
Pharmacy Patient Advocate Encounter   Received notification from Fax that prior authorization for Fluticasone-Salmeterol 100-50MCG/ACT aerosol powder  is required/requested.   Insurance verification completed.   The patient is insured through [employer] .   Per test claim: The current 30 day co-pay is, $0.00.  No PA needed at this time. This test claim was processed through [facility]- copay amounts may vary at other pharmacies due to pharmacy/plan contracts, or as the patient moves through the different stages of their insurance plan.

## 2023-11-24 ENCOUNTER — Other Ambulatory Visit (HOSPITAL_BASED_OUTPATIENT_CLINIC_OR_DEPARTMENT_OTHER): Payer: Self-pay

## 2023-11-24 ENCOUNTER — Other Ambulatory Visit: Payer: Self-pay

## 2023-11-24 ENCOUNTER — Encounter (HOSPITAL_BASED_OUTPATIENT_CLINIC_OR_DEPARTMENT_OTHER): Payer: Self-pay | Admitting: Emergency Medicine

## 2023-11-24 ENCOUNTER — Emergency Department (HOSPITAL_BASED_OUTPATIENT_CLINIC_OR_DEPARTMENT_OTHER)
Admission: EM | Admit: 2023-11-24 | Discharge: 2023-11-24 | Disposition: A | Discharge status: Home / Self Care | Payer: BC Managed Care – PPO

## 2023-11-24 ENCOUNTER — Emergency Department (HOSPITAL_BASED_OUTPATIENT_CLINIC_OR_DEPARTMENT_OTHER): Payer: BC Managed Care – PPO | Admitting: Radiology

## 2023-11-24 DIAGNOSIS — R059 Cough, unspecified: Secondary | ICD-10-CM | POA: Diagnosis not present

## 2023-11-24 DIAGNOSIS — R918 Other nonspecific abnormal finding of lung field: Secondary | ICD-10-CM | POA: Diagnosis not present

## 2023-11-24 DIAGNOSIS — R0789 Other chest pain: Secondary | ICD-10-CM | POA: Diagnosis not present

## 2023-11-24 DIAGNOSIS — J189 Pneumonia, unspecified organism: Secondary | ICD-10-CM | POA: Insufficient documentation

## 2023-11-24 DIAGNOSIS — Z20822 Contact with and (suspected) exposure to covid-19: Secondary | ICD-10-CM | POA: Diagnosis not present

## 2023-11-24 DIAGNOSIS — J45909 Unspecified asthma, uncomplicated: Secondary | ICD-10-CM | POA: Insufficient documentation

## 2023-11-24 DIAGNOSIS — J168 Pneumonia due to other specified infectious organisms: Secondary | ICD-10-CM | POA: Diagnosis not present

## 2023-11-24 DIAGNOSIS — Z7951 Long term (current) use of inhaled steroids: Secondary | ICD-10-CM | POA: Diagnosis not present

## 2023-11-24 DIAGNOSIS — R0602 Shortness of breath: Secondary | ICD-10-CM | POA: Diagnosis not present

## 2023-11-24 LAB — BASIC METABOLIC PANEL
Anion gap: 9 (ref 5–15)
BUN: 6 mg/dL (ref 6–20)
CO2: 27 mmol/L (ref 22–32)
Calcium: 9.5 mg/dL (ref 8.9–10.3)
Chloride: 101 mmol/L (ref 98–111)
Creatinine, Ser: 0.78 mg/dL (ref 0.44–1.00)
GFR, Estimated: >60 mL/min (ref >60)
Glucose, Bld: 102 mg/dL — ABNORMAL HIGH (ref 70–99)
Potassium: 4.1 mmol/L (ref 3.5–5.1)
Sodium: 137 mmol/L (ref 135–145)

## 2023-11-24 LAB — RESP PANEL BY RT-PCR (RSV, FLU A&B, COVID)  RVPGX2
Influenza A by PCR: NEGATIVE
Influenza B by PCR: NEGATIVE
Resp Syncytial Virus by PCR: NEGATIVE
SARS Coronavirus 2 by RT PCR: NEGATIVE

## 2023-11-24 LAB — CBC
HCT: 43.6 % (ref 36.0–46.0)
Hemoglobin: 14.5 g/dL (ref 12.0–15.0)
MCH: 29.4 pg (ref 26.0–34.0)
MCHC: 33.3 g/dL (ref 30.0–36.0)
MCV: 88.4 fL (ref 80.0–100.0)
Platelets: 316 10*3/uL (ref 150–400)
RBC: 4.93 MIL/uL (ref 3.87–5.11)
RDW: 13.2 % (ref 11.5–15.5)
WBC: 5.7 10*3/uL (ref 4.0–10.5)
nRBC: 0 % (ref 0.0–0.2)

## 2023-11-24 LAB — TROPONIN I (HIGH SENSITIVITY): Troponin I (High Sensitivity): 2 ng/L (ref <18)

## 2023-11-24 MED ORDER — DOXYCYCLINE HYCLATE 100 MG PO CAPS
100.0000 mg | ORAL_CAPSULE | Freq: Two times a day (BID) | ORAL | 0 refills | Status: AC
  Filled 2023-11-24: qty 10, 5d supply, fill #0

## 2023-11-24 MED ORDER — ALBUTEROL SULFATE (2.5 MG/3ML) 0.083% IN NEBU
2.5000 mg | INHALATION_SOLUTION | Freq: Once | RESPIRATORY_TRACT | Status: AC
  Administered 2023-11-24: 2.5 mg via RESPIRATORY_TRACT
  Filled 2023-11-24: qty 3

## 2023-11-24 MED ORDER — ALBUTEROL SULFATE HFA 108 (90 BASE) MCG/ACT IN AERS: 1.0000 | INHALATION_SPRAY | Freq: Four times a day (QID) | RESPIRATORY_TRACT | 0 refills | Status: AC | PRN

## 2023-11-24 MED ORDER — ALBUTEROL SULFATE HFA 108 (90 BASE) MCG/ACT IN AERS
1.0000 | INHALATION_SPRAY | Freq: Four times a day (QID) | RESPIRATORY_TRACT | 0 refills | Status: DC | PRN
  Filled 2023-11-24: qty 6.7, 25d supply, fill #0

## 2023-11-24 NOTE — ED Provider Notes (Signed)
[facility] AT [facility] Provider Note   CSN: [id number] Arrival date & time: 11/24/23  0800     History Chief Complaint  Patient presents with   Shortness of Breath    Kathryn Erickson is a 43 y.o. female.  Patient past history significant for asthma, migraine headaches presents to the ED with concerns of shortness of breath.  She reports associated cough, chest tightness, and difficulty with deep inspiration for about 3 days.  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  Denies any leg swelling, hemoptysis, OCP use, recent surgery or trauma, prior PE or DVT.  Unsure of any fevers at home but does report that she has had some chills.   Shortness of Breath      Home Medications Prior to Admission medications   Medication Sig Start Date End Date Taking? Authorizing Provider  albuterol (VENTOLIN HFA) 108 (90 Base) MCG/ACT inhaler Inhale 1-2 puffs into the lungs every 6 (six) hours as needed for wheezing or shortness of breath. 11/24/23  Yes Smitty Knudsen, PA-C  albuterol (VENTOLIN HFA) 108 (90 Base) MCG/ACT inhaler Inhale 1-2 puffs into the lungs every 6 (six) hours as needed for wheezing or shortness of breath. 11/24/23  Yes Smitty Knudsen, PA-C  doxycycline (VIBRAMYCIN) 100 MG capsule Take 1 capsule (100 mg total) by mouth 2 (two) times daily for 5 days. 11/24/23 11/29/23 Yes Smitty Knudsen, PA-C  fluticasone-salmeterol (ADVAIR) 100-50 MCG/ACT AEPB Inhale 1 puff into the lungs 2 (two) times daily. 11/15/22   [place], Betty G, MD      Allergies    Amoxicillin and Cephalexin    Review of Systems   Review of Systems  Respiratory:  Positive for shortness of breath.   All other systems reviewed and are negative.   Physical Exam Updated Vital Signs BP 121/77   Pulse 100   Temp 98.7 F (37.1 C) (Oral)   Resp 12   Wt 80.8 kg   LMP 10/29/2023   SpO2 99%   BMI 33.66 kg/m  Physical Exam Vitals and nursing note reviewed.   Constitutional:      General: She is not in acute distress.    Appearance: She is well-developed.  HENT:     Head: Normocephalic and atraumatic.  Eyes:     Conjunctiva/sclera: Conjunctivae normal.  Cardiovascular:     Rate and Rhythm: Normal rate and regular rhythm.     Heart sounds: No murmur heard. Pulmonary:     Effort: Pulmonary effort is normal. No respiratory distress.     Breath sounds: Examination of the right-upper field reveals wheezing. Examination of the left-upper field reveals wheezing. Examination of the right-middle field reveals wheezing. Examination of the left-middle field reveals wheezing. Wheezing present. No decreased breath sounds, rhonchi or rales.  Abdominal:     Palpations: Abdomen is soft.     Tenderness: There is no abdominal tenderness.  Musculoskeletal:        General: No swelling.     Cervical back: Neck supple.  Skin:    General: Skin is warm and dry.     Capillary Refill: Capillary refill takes less than 2 seconds.  Neurological:     Mental Status: She is alert.  Psychiatric:        Mood and Affect: Mood normal.     ED Results / Procedures / Treatments   Labs (all labs ordered are listed, but only abnormal results are displayed) Labs Reviewed  BASIC METABOLIC  PANEL - Abnormal; Notable for the following components:      Result Value   Glucose, Bld 102 (*)    All other components within normal limits  RESP PANEL BY RT-PCR (RSV, FLU A&B, COVID)  RVPGX2  CBC  PREGNANCY, URINE  TROPONIN I (HIGH SENSITIVITY)  TROPONIN I (HIGH SENSITIVITY)    EKG EKG Interpretation Date/Time:  Thursday November 24 2023 08:13:02 EST Ventricular Rate:  102 PR Interval:  96 QRS Duration:  80 QT Interval:  344 QTC Calculation: 448 R Axis:   70  Text Interpretation: Sinus tachycardia with short PR Septal infarct , age undetermined Abnormal ECG When compared with ECG of 15-Apr-2014 03:55, PREVIOUS ECG IS PRESENT Confirmed by Estanislado Pandy [phone]) on  11/24/2023 10:12:22 AM  Radiology DG Chest 2 View Result Date: 11/24/2023 CLINICAL DATA:  Shortness of breath and cough with chest tightness EXAM: CHEST - 2 VIEW COMPARISON:  Chest radiograph dated 10/29/2017 FINDINGS: Normal lung volumes. Small nodular focus projecting over the lateral right upper lung overlapping the right anterolateral third rib. Bibasilar patchy opacities. No pleural effusion or pneumothorax. The heart size and mediastinal contours are within normal limits. No acute osseous abnormality. IMPRESSION: 1. Bibasilar patchy opacities, which may represent atelectasis or infection. 2. Small nodular focus projecting over the lateral right upper lung overlapping the right anterolateral third rib, which may represent artifact related to summation of structures or a pulmonary nodule. Recommend further evaluation with nonemergent chest CT. Electronically Signed   By: Agustin Cree M.D.   On: 11/24/2023 09:15    Procedures Procedures    Medications Ordered in ED Medications  albuterol (PROVENTIL) (2.5 MG/3ML) 0.083% nebulizer solution 2.5 mg (2.5 mg Nebulization Given 11/24/23 9147)    ED Course/ Medical Decision Making/ A&P                                 Medical Decision Making Amount and/or Complexity of Data Reviewed Labs: ordered. Radiology: ordered.  Risk Prescription drug management.   This patient presents to the ED for concern of shortness of breath.  Differential diagnosis includes COVID-19, influenza, PE, pneumonia, asthma laceration, bronchitis   Lab Tests:  I Ordered, and personally interpreted labs.  The pertinent results include: CBC unremarkable, BMP with mild hyperglycemia 102, troponin negative at 2, respiratory panel negative for COVID-19, influenza and RSV   Imaging Studies ordered:  I ordered imaging studies including chest x-ray I independently visualized and interpreted imaging which showed Bibasilar patchy opacities, which may represent atelectasis or  infection. 2. Small nodular focus projecting over the lateral right upper lung overlapping the right anterolateral third rib, which may represent artifact related to summation of structures or a pulmonary nodule. Recommend further evaluation with nonemergent chest CT. I agree with the radiologist interpretation   Medicines ordered and prescription drug management:  I ordered medication including albuterol for wheezing Reevaluation of the patient after these medicines showed that the patient improved I have reviewed the patients home medicines and have made adjustments as needed   Problem List / ED Course:  Patient with past history significant for asthma, migraine headaches presents to the emergency department concerns of shortness of breath.  She states that she has been feeling short of breath with associated cough, chest tightness, and difficulty with deep inspiration for about 3 days.  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  No recent hemoptysis, unilateral leg swelling,  recent surgery or trauma, prior PE or DVT, or OCP or hormonal medication use.  Patient is PERC negative. On exam, there is notable wheezing to bilateral upper and middle lung fields.  Appears this may be an asthma exacerbation.  No appreciable rales, rhonchi, or friction rub.  Differential includes asthma exacerbation, viral URI, pneumonia, bronchitis.  Will treat the shortness of breath and wheezing with nebulized albuterol and reassess shortly as results return. Patient reports improvement in work of breathing with the albuterol.  Wheezing improved.  Workup is revealing for concerns for possible pneumonia and bibasilar areas of the lungs.  Given patient's history of asthma, will initiate antibiotic therapy although this could still be a process of a viral URI.  Patient has a history of amoxicillin allergy so we will plan on discharging home with doxycycline.  Advised patient to follow-up with PCP  for repeat evaluation to ensure symptoms are improving.  Albuterol also sent to pharmacy for work of breathing.  Discussed return precautions.  Patient discharged home in stable condition.  Final Clinical Impression(s) / ED Diagnoses Final diagnoses:  Pneumonia of both lungs due to infectious organism, unspecified part of lung    Rx / DC Orders ED Discharge Orders          Ordered    doxycycline (VIBRAMYCIN) 100 MG capsule  2 times daily        11/24/23 1119    albuterol (VENTOLIN HFA) 108 (90 Base) MCG/ACT inhaler  Every 6 hours PRN        11/24/23 1119    albuterol (VENTOLIN HFA) 108 (90 Base) MCG/ACT inhaler  Every 6 hours PRN        11/24/23 1120              Smitty Knudsen, PA-C 11/24/23 1121    YoungHarmon Dun, DO 11/24/23 1605

## 2023-11-24 NOTE — ED Triage Notes (Signed)
Pt c/o shob and cough with chest tightness, difficult to take deep inspiration, decreased appetite x 3 days

## 2023-11-24 NOTE — ED Notes (Signed)
In radiology

## 2023-11-24 NOTE — Discharge Instructions (Signed)
You are seen in the emergency department today for concerns of shortness of breath and a cough.  Your labs were thankfully reassuring he had no signs of COVID-19, flu and RSV.  Your chest x-ray however was somewhat concerning for possible signs of either scarring or infection present in the lower portions of your lung.  Given your history of asthma, prefer to start antibiotics reduce the risk of complications from this.  Prescription for doxycycline sent to your pharmacy given her history of an amoxicillin allergy.  Please take this as prescribed.  I have also sent albuterol to your pharmacy to help with any wheezing or trouble breathing you may notice.  Please plan on following up with your primary care provider to be reassessed to ensure symptoms are improving.  If you have any concerns of new or worsening symptoms, return to the emergency department.

## 2023-12-26 ENCOUNTER — Encounter: Payer: Self-pay | Admitting: Family Medicine

## 2023-12-30 ENCOUNTER — Ambulatory Visit: Admitting: Family Medicine

## 2024-02-06 ENCOUNTER — Emergency Department (HOSPITAL_COMMUNITY)
Admission: EM | Admit: 2024-02-06 | Discharge: 2024-02-06 | Disposition: A | Discharge status: Home / Self Care | Attending: Emergency Medicine | Admitting: Emergency Medicine

## 2024-02-06 ENCOUNTER — Other Ambulatory Visit: Payer: Self-pay

## 2024-02-06 ENCOUNTER — Emergency Department (HOSPITAL_COMMUNITY)

## 2024-02-06 DIAGNOSIS — J189 Pneumonia, unspecified organism: Secondary | ICD-10-CM | POA: Diagnosis not present

## 2024-02-06 DIAGNOSIS — Z7951 Long term (current) use of inhaled steroids: Secondary | ICD-10-CM | POA: Insufficient documentation

## 2024-02-06 DIAGNOSIS — E876 Hypokalemia: Secondary | ICD-10-CM | POA: Insufficient documentation

## 2024-02-06 DIAGNOSIS — J4521 Mild intermittent asthma with (acute) exacerbation: Secondary | ICD-10-CM | POA: Insufficient documentation

## 2024-02-06 DIAGNOSIS — Z7952 Long term (current) use of systemic steroids: Secondary | ICD-10-CM | POA: Diagnosis not present

## 2024-02-06 DIAGNOSIS — R062 Wheezing: Secondary | ICD-10-CM | POA: Diagnosis not present

## 2024-02-06 DIAGNOSIS — D72829 Elevated white blood cell count, unspecified: Secondary | ICD-10-CM | POA: Insufficient documentation

## 2024-02-06 DIAGNOSIS — J45909 Unspecified asthma, uncomplicated: Secondary | ICD-10-CM | POA: Insufficient documentation

## 2024-02-06 DIAGNOSIS — J4520 Mild intermittent asthma, uncomplicated: Secondary | ICD-10-CM

## 2024-02-06 DIAGNOSIS — R059 Cough, unspecified: Secondary | ICD-10-CM | POA: Diagnosis not present

## 2024-02-06 DIAGNOSIS — R0602 Shortness of breath: Secondary | ICD-10-CM | POA: Diagnosis not present

## 2024-02-06 LAB — CBC WITH DIFFERENTIAL/PLATELET
Abs Immature Granulocytes: 0.04 10*3/uL (ref 0.00–0.07)
Basophils Absolute: 0.1 10*3/uL (ref 0.0–0.1)
Basophils Relative: 1 %
Eosinophils Absolute: 0.7 10*3/uL — ABNORMAL HIGH (ref 0.0–0.5)
Eosinophils Relative: 6 %
HCT: 42.9 % (ref 36.0–46.0)
Hemoglobin: 14 g/dL (ref 12.0–15.0)
Immature Granulocytes: 0 %
Lymphocytes Relative: 24 %
Lymphs Abs: 2.9 10*3/uL (ref 0.7–4.0)
MCH: 29.5 pg (ref 26.0–34.0)
MCHC: 32.6 g/dL (ref 30.0–36.0)
MCV: 90.3 fL (ref 80.0–100.0)
Monocytes Absolute: 0.7 10*3/uL (ref 0.1–1.0)
Monocytes Relative: 6 %
Neutro Abs: 7.8 10*3/uL — ABNORMAL HIGH (ref 1.7–7.7)
Neutrophils Relative %: 63 %
Platelets: 364 10*3/uL (ref 150–400)
RBC: 4.75 MIL/uL (ref 3.87–5.11)
RDW: 13.2 % (ref 11.5–15.5)
WBC: 12.2 10*3/uL — ABNORMAL HIGH (ref 4.0–10.5)
nRBC: 0 % (ref 0.0–0.2)

## 2024-02-06 LAB — RESP PANEL BY RT-PCR (RSV, FLU A&B, COVID)  RVPGX2
Influenza A by PCR: NEGATIVE
Influenza B by PCR: NEGATIVE
Resp Syncytial Virus by PCR: NEGATIVE
SARS Coronavirus 2 by RT PCR: NEGATIVE

## 2024-02-06 LAB — COMPREHENSIVE METABOLIC PANEL WITH GFR
ALT: 24 U/L (ref 0–44)
AST: 28 U/L (ref 15–41)
Albumin: 3.8 g/dL (ref 3.5–5.0)
Alkaline Phosphatase: 55 U/L (ref 38–126)
Anion gap: 10 (ref 5–15)
BUN: 10 mg/dL (ref 6–20)
CO2: 24 mmol/L (ref 22–32)
Calcium: 8.3 mg/dL — ABNORMAL LOW (ref 8.9–10.3)
Chloride: 104 mmol/L (ref 98–111)
Creatinine, Ser: 0.73 mg/dL (ref 0.44–1.00)
GFR, Estimated: >60 mL/min (ref >60)
Glucose, Bld: 152 mg/dL — ABNORMAL HIGH (ref 70–99)
Potassium: 2.9 mmol/L — ABNORMAL LOW (ref 3.5–5.1)
Sodium: 138 mmol/L (ref 135–145)
Total Bilirubin: 0.9 mg/dL (ref 0.0–1.2)
Total Protein: 7.2 g/dL (ref 6.5–8.1)

## 2024-02-06 LAB — HCG, SERUM, QUALITATIVE: Preg, Serum: NEGATIVE

## 2024-02-06 LAB — BRAIN NATRIURETIC PEPTIDE: B Natriuretic Peptide: 44.1 pg/mL (ref 0.0–100.0)

## 2024-02-06 MED ORDER — ALBUTEROL SULFATE (2.5 MG/3ML) 0.083% IN NEBU
10.0000 mg/h | INHALATION_SOLUTION | RESPIRATORY_TRACT | Status: AC
  Administered 2024-02-06: 10 mg/h via RESPIRATORY_TRACT
  Filled 2024-02-06: qty 12

## 2024-02-06 MED ORDER — ALBUTEROL SULFATE HFA 108 (90 BASE) MCG/ACT IN AERS
2.0000 | INHALATION_SPRAY | Freq: Once | RESPIRATORY_TRACT | Status: AC
  Administered 2024-02-06: 2 via RESPIRATORY_TRACT
  Filled 2024-02-06: qty 6.7

## 2024-02-06 MED ORDER — POTASSIUM CHLORIDE 10 MEQ/100ML IV SOLN
10.0000 meq | INTRAVENOUS | Status: AC
  Administered 2024-02-06 (×2): 10 meq via INTRAVENOUS
  Filled 2024-02-06 (×2): qty 100

## 2024-02-06 MED ORDER — ALBUTEROL SULFATE HFA 108 (90 BASE) MCG/ACT IN AERS: 1.0000 | INHALATION_SPRAY | RESPIRATORY_TRACT | 3 refills | Status: AC | PRN

## 2024-02-06 MED ORDER — PREDNISONE 10 MG PO TABS: 40.0000 mg | ORAL_TABLET | Freq: Every day | ORAL | 0 refills | Status: AC

## 2024-02-06 MED ORDER — POTASSIUM CHLORIDE CRYS ER 20 MEQ PO TBCR
40.0000 meq | EXTENDED_RELEASE_TABLET | Freq: Once | ORAL | Status: AC
  Administered 2024-02-06: 40 meq via ORAL
  Filled 2024-02-06: qty 2

## 2024-02-06 MED ORDER — SODIUM CHLORIDE 0.9 % IV BOLUS
1000.0000 mL | Freq: Once | INTRAVENOUS | Status: AC
  Administered 2024-02-06: 1000 mL via INTRAVENOUS

## 2024-02-06 NOTE — ED Triage Notes (Signed)
 Pt BIBA from home for asthma exacerbation. Has not had her advair for more than a week. Wheezes all over, diminished lower fields. Some improvement after 2 duoneg, 125mg  solumedrol 2g mag. 18ga RAC  140/69 100% HR 96

## 2024-02-06 NOTE — Discharge Instructions (Addendum)
 A prescription for prednisone  was sent to your pharmacy.  Take as prescribed for the next 5 days.  Use inhaler as needed for cough and shortness of breath.  Return to the emergency department for any worsening symptoms.

## 2024-02-06 NOTE — ED Provider Notes (Signed)
 [place] DEPARTMENT AT [facility] Provider Note   CSN: [id number] Arrival date & time: 02/06/24  4782     History  Chief Complaint  Patient presents with   Shortness of Breath    Kathryn Erickson is a 43 y.o. female.  HPI Patient presents for shortness of breath.  Medical history includes asthma since childhood.  She has Advair and rescue inhaler at home.  She was treated for pneumonia in January but did not complete antibiotics.  Over the past several weeks, she has had some mild shortness of breath which she attributes to seasonal pollen.  Early this morning, shortness of breath acutely worsened.  EMS was called to her home.  They noted diffuse wheezing on lung auscultation.  She received 2 DuoNebs, 125 mg of Solu-Medrol, 2 g of magnesium prior to arrival.  This did improve her symptoms.  She remains short of breath at this time.  She denies any areas of discomfort.    Home Medications Prior to Admission medications   Medication Sig Start Date End Date Taking? Authorizing Provider  predniSONE  (DELTASONE ) 10 MG tablet Take 4 tablets (40 mg total) by mouth daily for 5 days. 02/06/24 02/11/24 Yes Iva Mariner, MD  albuterol  (VENTOLIN  HFA) 108 [phone] Base) MCG/ACT inhaler Inhale 1-2 puffs into the lungs every 6 (six) hours as needed for wheezing or shortness of breath. 11/24/23   Zelaya, Oscar A, PA-C  albuterol  (VENTOLIN  HFA) 108 (90 Base) MCG/ACT inhaler Inhale 1-2 puffs into the lungs every 4 (four) hours as needed for wheezing or shortness of breath. 02/06/24   Iva Mariner, MD  fluticasone -salmeterol (ADVAIR) 100-50 MCG/ACT AEPB Inhale 1 puff into the lungs 2 (two) times daily. 11/15/22   [place], Betty G, MD      Allergies    Amoxicillin and Cephalexin    Review of Systems   Review of Systems  Respiratory:  Positive for cough, chest tightness, shortness of breath and wheezing.   All other systems reviewed and are negative.   Physical Exam Updated Vital Signs BP  122/81   Pulse 92   Temp 97.7 F (36.5 C) (Oral)   Resp 14   SpO2 100%  Physical Exam Vitals and nursing note reviewed.  Constitutional:      General: She is not in acute distress.    Appearance: Normal appearance. She is well-developed. She is not ill-appearing, toxic-appearing or diaphoretic.  HENT:     Head: Normocephalic and atraumatic.     Right Ear: External ear normal.     Left Ear: External ear normal.     Nose: Nose normal.     Mouth/Throat:     Mouth: Mucous membranes are moist.  Eyes:     Extraocular Movements: Extraocular movements intact.     Conjunctiva/sclera: Conjunctivae normal.  Cardiovascular:     Rate and Rhythm: Normal rate and regular rhythm.     Heart sounds: No murmur heard. Pulmonary:     Effort: Pulmonary effort is normal. No respiratory distress.     Breath sounds: No stridor. Wheezing present. No rhonchi or rales.  Abdominal:     General: There is no distension.     Palpations: Abdomen is soft.     Tenderness: There is no abdominal tenderness.  Musculoskeletal:        General: No swelling. Normal range of motion.     Cervical back: Normal range of motion and neck supple.     Right lower leg: No edema.  Left lower leg: No edema.  Skin:    General: Skin is warm and dry.     Coloration: Skin is not jaundiced or pale.  Neurological:     General: No focal deficit present.     Mental Status: She is alert and oriented to person, place, and time.  Psychiatric:        Mood and Affect: Mood normal.        Behavior: Behavior normal.     ED Results / Procedures / Treatments   Labs (all labs ordered are listed, but only abnormal results are displayed) Labs Reviewed  COMPREHENSIVE METABOLIC PANEL WITH GFR - Abnormal; Notable for the following components:      Result Value   Potassium 2.9 (*)    Glucose, Bld 152 (*)    Calcium 8.3 (*)    All other components within normal limits  CBC WITH DIFFERENTIAL/PLATELET - Abnormal; Notable for the  following components:   WBC 12.2 (*)    Neutro Abs 7.8 (*)    Eosinophils Absolute 0.7 (*)    All other components within normal limits  RESP PANEL BY RT-PCR (RSV, FLU A&B, COVID)  RVPGX2  HCG, SERUM, QUALITATIVE  BRAIN NATRIURETIC PEPTIDE    EKG EKG Interpretation Date/Time:  Monday February 06 2024 07:37:01 EDT Ventricular Rate:  92 PR Interval:    QRS Duration:  95 QT Interval:  444 QTC Calculation: 550 R Axis:   59  Text Interpretation: indeterminate rhythm Prolonged QT interval Confirmed by Iva Mariner ([id number]) on 02/06/2024 8:01:33 AM  Radiology DG Chest Port 1 View Result Date: 02/06/2024 CLINICAL DATA:  Shortness of breath. History of asthma and recent pneumonia. EXAM: PORTABLE CHEST 1 VIEW COMPARISON:  Radiographs 11/24/2023 and 10/29/2017. FINDINGS: 0712 hours. The heart size and mediastinal contours are normal. The lungs are clear. There is no pleural effusion or pneumothorax. No acute osseous findings are identified. IMPRESSION: No evidence of acute cardiopulmonary process. Electronically Signed   By: Elmon Hagedorn M.D.   On: 02/06/2024 07:57    Procedures Procedures    Medications Ordered in ED Medications  albuterol  (PROVENTIL ) (2.5 MG/3ML) 0.083% nebulizer solution (0 mg/hr Nebulization Stopped 02/06/24 0830)  potassium chloride 10 mEq in 100 mL IVPB (10 mEq Intravenous New Bag/Given 02/06/24 0931)  albuterol  (VENTOLIN  HFA) 108 (90 Base) MCG/ACT inhaler 2 puff (2 puffs Inhalation Given 02/06/24 0929)  potassium chloride SA (KLOR-CON M) CR tablet 40 mEq (40 mEq Oral Given 02/06/24 0932)  sodium chloride  0.9 % bolus 1,000 mL (1,000 mLs Intravenous New Bag/Given 02/06/24 0930)    ED Course/ Medical Decision Making/ A&P                                 Medical Decision Making Amount and/or Complexity of Data Reviewed Labs: ordered. Radiology: ordered.  Risk Prescription drug management.   This patient presents to the ED for concern of shortness of breath, this  involves an extensive number of treatment options, and is a complaint that carries with it a high risk of complications and morbidity.  The differential diagnosis includes asthma exacerbation, URI, pneumonia, anxiety   Co morbidities that complicate the patient evaluation  Asthma   Additional history obtained:  Additional history obtained from EMS External records from outside source obtained and reviewed including EMR   Lab Tests:  I Ordered, and personally interpreted labs.  The pertinent results include: Hypokalemia and mild leukocytosis are present.  Lab work is otherwise unremarkable.   Imaging Studies ordered:  I ordered imaging studies including chest x-ray I independently visualized and interpreted imaging which showed no acute findings I agree with the radiologist interpretation   Cardiac Monitoring: / EKG:  The patient was maintained on a cardiac monitor.  I personally viewed and interpreted the cardiac monitored which showed an underlying rhythm of: Sinus rhythm   Problem List / ED Course / Critical interventions / Medication management  Patient presenting for shortness of breath.  She has a history of asthma.  She denies any prior hospitalizations for asthma exacerbation.  Although she has been short of breath for several weeks, this acutely worsened earlier this morning.  She was treated for asthma exacerbation by EMS prior to arrival.  Although her symptoms are improved, she remains short of breath.  On exam, she is mildly tachypneic.  She is able to speak in complete sentences.  She has continued diffuse wheezing, inspiratory and expiratory, throughout lung fields.  Continuous albuterol  was ordered.  Workup was initiated.  Lab work notable for leukocytosis and hypokalemia.  Replacement potassium was ordered.  On reassessment, shortness of breath has improved.  Lung sounds now notable for end expiratory wheezing only.  She was given albuterol  inhaler.  On further  reassessment, patient has continued improved symptoms.  Lungs are now clear to auscultation.  She was prescribed prednisone  and advised to return for any worsening of symptoms.  She was discharged in stable condition. I ordered medication including albuterol  for asthma exacerbation Reevaluation of the patient after these medicines showed that the patient improved I have reviewed the patients home medicines and have made adjustments as needed   Social Determinants of Health:  Lives independently        Final Clinical Impression(s) / ED Diagnoses Final diagnoses:  Mild intermittent asthma with exacerbation  Hypokalemia    Rx / DC Orders ED Discharge Orders          Ordered    predniSONE  (DELTASONE ) 10 MG tablet  Daily        02/06/24 0904    albuterol  (VENTOLIN  HFA) 108 (90 Base) MCG/ACT inhaler  Every 4 hours PRN        02/06/24 0904              Iva Mariner, MD 02/06/24 [phone]

## 2024-02-14 ENCOUNTER — Encounter: Payer: Self-pay | Admitting: Family Medicine

## 2024-02-14 ENCOUNTER — Ambulatory Visit (INDEPENDENT_AMBULATORY_CARE_PROVIDER_SITE_OTHER): Admitting: Family Medicine

## 2024-02-14 ENCOUNTER — Ambulatory Visit: Admitting: Family Medicine

## 2024-02-14 VITALS — BP 120/88 | HR 100 | Temp 96.0°F | Resp 12 | Ht 61.0 in | Wt 159.8 lb

## 2024-02-14 DIAGNOSIS — K219 Gastro-esophageal reflux disease without esophagitis: Secondary | ICD-10-CM | POA: Diagnosis not present

## 2024-02-14 DIAGNOSIS — R9389 Abnormal findings on diagnostic imaging of other specified body structures: Secondary | ICD-10-CM | POA: Diagnosis not present

## 2024-02-14 DIAGNOSIS — J4521 Mild intermittent asthma with (acute) exacerbation: Secondary | ICD-10-CM | POA: Diagnosis not present

## 2024-02-14 MED ORDER — OMEPRAZOLE 40 MG PO CPDR: 40.0000 mg | DELAYED_RELEASE_CAPSULE | Freq: Every day | ORAL | 0 refills | Status: DC

## 2024-02-14 MED ORDER — IPRATROPIUM-ALBUTEROL 0.5-2.5 (3) MG/3ML IN SOLN
3.0000 mL | Freq: Once | RESPIRATORY_TRACT | Status: AC
  Administered 2024-02-14: 3 mL via RESPIRATORY_TRACT

## 2024-02-14 MED ORDER — AEROCHAMBER PLUS FLO-VU MISC: 0 refills | Status: AC

## 2024-02-14 MED ORDER — BUDESONIDE-FORMOTEROL FUMARATE 160-4.5 MCG/ACT IN AERO: 2.0000 | INHALATION_SPRAY | Freq: Two times a day (BID) | RESPIRATORY_TRACT | 3 refills | Status: AC

## 2024-02-14 NOTE — Assessment & Plan Note (Signed)
 With acute exacerbation, she just completed prednisone  treatment. After verbal consent, she received breathing treatment with DuoNeb. Ventilation improved, diffuse wheezing. No rales or rhonchi. She agrees with holding on repeating prednisone  taper for now. Symbicort 160-4.5 mcg 2 puff twice daily. Albuterol  inh 2 puff every 6 hours for a week then as needed for wheezing or shortness of breath.  Follow-up in 4 weeks, before if needed.

## 2024-02-14 NOTE — Patient Instructions (Addendum)
 A few things to remember from today's visit:  Mild intermittent asthma with acute exacerbation - Plan: budesonide-formoterol (SYMBICORT) 160-4.5 MCG/ACT inhaler, Spacer/Aero-Holding Chambers (AEROCHAMBER PLUS) Device, ipratropium-albuterol  (DUONEB) 0.5-2.5 (3) MG/3ML nebulizer solution 3 mL  Gastroesophageal reflux disease, unspecified whether esophagitis present - Plan: omeprazole (PRILOSEC) 40 MG capsule  Abnormal CXR - Plan: CT Chest Wo Contrast Take your medication for acid reflux, omeprazole 40 mg, 30 minutes before lunch or first meal for 6 to 8 weeks then decrease dose to 20 mg daily for 10 days then every other day at this time you can change to famotidine.  Advair changed to Symbicort to use 2 puff twice per day with a spacer. Albuterol  inhaler 2 puff every 6 hours for a week and then as needed.  If you need refills for medications you take chronically, please call your pharmacy. Do not use My Chart to request refills or for acute issues that need immediate attention. If you send a my chart message, it may take a few days to be addressed, specially if I am not in the office.  Please be sure medication list is accurate. If a new problem present, please set up appointment sooner than planned today.

## 2024-02-14 NOTE — Assessment & Plan Note (Signed)
 Problem is not well-controlled, could be contributing to persistent cough. Omeprazole 40 mg recommended, 30 minutes before first meal for 8 weeks then decrease dose to 20 mg and continue weaning of medication. GERD precautions are recommended.

## 2024-02-14 NOTE — Progress Notes (Signed)
 ACUTE VISIT Chief Complaint  Patient presents with   Follow-up    Breathing issues, lump in throat, and headaches   HPI: [name] is a 42 y.o. female, with a PMHx significant for migraine and asthma, who is here today with a friend for ED follow up and above concerns.   Patient went to the ED on 02/06/2024 for shortness of breath.  Diagnosed with asthma exacerbation, treated with albuterol  inhaler and prednisone , which she completed 2 days ago. ED visit on 2/13/ Dx'ed with pneumonia.  She completed treatment with doxycycline .  She complains she has had intermittent wheezing and SOB since 10/2023, exacerbated by exertion. No associated CP, palpitation, or diaphoresis. She says her symptoms are up and down in quality in general, and worsen with heat.  She has been out of her advair inhaler for more than a month. Uses an albuterol  inhaler three times per day.   Further endorses some postnasal drainage and heartburn.  Not currently taking medicine for acid reflux.  No smoking hx.  Also having intermittent frontal parietal headaches since 10/2023. The headache is a throbbing headache that she rates as a 7/10 on average. She has some associated neck pain, but denies nausea, vomiting, or photophobia. She has had similar headaches in the past.  She sometimes takes excedrin for the headache but most of the time resolves spontaneously.  Pertinent negatives include fever, chills, body aches, ear discomfort, nasal congestion, rhinorrhea  Chest X-ray 1 view from 02/06/2024:  No evidence of acute cardiopulmonary process.  Chest X-ray from 11/24/2023 Impression:   1.) Bibasilar patchy opacities, which may represent atelectasis or infection.  2.) Small nodular focus projecting over the lateral right upper lung overlapping the right anterolateral third rib, which may represent artifact related to summation of structures or a pulmonary nodule. Recommend further evaluation with nonemergent  chest CT.   Throat lump:  Patient also complains of bilateral lumps in submandibular area, says the one on the right is larger.  Denies oral lesions, discomfort eating, sore throat, abnormal weight loss, or night sweats.   Review of Systems  Constitutional:  Positive for fatigue. Negative for appetite change.  HENT:  Negative for facial swelling and trouble swallowing.   Cardiovascular:  Negative for palpitations and leg swelling.  Gastrointestinal:  Negative for abdominal pain, nausea and vomiting.  Endocrine: Negative for cold intolerance and heat intolerance.  Genitourinary:  Negative for decreased urine volume and hematuria.  Musculoskeletal:  Negative for gait problem and joint swelling.  Skin:  Negative for rash.  Allergic/Immunologic: Positive for environmental allergies.  Neurological:  Negative for tremors, facial asymmetry and weakness.  Psychiatric/Behavioral:  Negative for confusion.   See other pertinent positives and negatives in HPI.  Current Outpatient Medications on File Prior to Visit  Medication Sig Dispense Refill   albuterol  (VENTOLIN  HFA) 108 (90 Base) MCG/ACT inhaler Inhale 1-2 puffs into the lungs every 6 (six) hours as needed for wheezing or shortness of breath. 1 each 0   albuterol  (VENTOLIN  HFA) 108 (90 Base) MCG/ACT inhaler Inhale 1-2 puffs into the lungs every 4 (four) hours as needed for wheezing or shortness of breath. 18 g 3   No current facility-administered medications on file prior to visit.   Past Medical History:  Diagnosis Date   Allergy    GERD (gastroesophageal reflux disease)    Allergies  Allergen Reactions   Amoxicillin Rash    Has patient had a PCN reaction causing immediate rash, facial/tongue/throat swelling, SOB or  lightheadedness with hypotension: Yes Has patient had a PCN reaction causing severe rash involving mucus membranes or skin necrosis: Yes Has patient had a PCN reaction that required hospitalization: No Has patient had a  PCN reaction occurring within the last 10 years: No If all of the above answers are "NO", then may proceed with Cephalosporin use.   Cephalexin Rash   Social History   Socioeconomic History   Marital status: Married    Spouse name: Not on file   Number of children: Not on file   Years of education: Not on file   Highest education level: Not on file  Occupational History   Not on file  Tobacco Use   Smoking status: Never   Smokeless tobacco: Never  Substance and Sexual Activity   Alcohol use: No   Drug use: No   Sexual activity: Yes    Birth control/protection: None  Other Topics Concern   Not on file  Social History Narrative   Not on file   Social Drivers of Health   Financial Resource Strain: Not on file  Food Insecurity: Not on file  Transportation Needs: Not on file  Physical Activity: Not on file  Stress: Not on file  Social Connections: Not on file   Vitals:   02/14/24 1316  BP: 120/88  Pulse: 100  Resp: 12  Temp: (!) 96 F (35.6 C)  SpO2: 96%   Body mass index is 30.19 kg/m.  Physical Exam Vitals and nursing note reviewed.  Constitutional:      General: She is not in acute distress.    Appearance: She is well-developed.  HENT:     Head: Normocephalic and atraumatic.     Nose:     Right Turbinates: Not enlarged.     Left Turbinates: Not enlarged.     Mouth/Throat:     Mouth: Mucous membranes are moist.     Pharynx: Oropharynx is clear. Uvula midline. Postnasal drip present.  Eyes:     Conjunctiva/sclera: Conjunctivae normal.  Neck:     Comments: No masses appreciated on area of concern. Cardiovascular:     Rate and Rhythm: Normal rate and regular rhythm.     Heart sounds: No murmur heard. Pulmonary:     Effort: Pulmonary effort is normal. No respiratory distress.     Breath sounds: No stridor. Decreased breath sounds and wheezing present. No rhonchi or rales.  Abdominal:     Palpations: Abdomen is soft. There is no mass.     Tenderness:  There is no abdominal tenderness.  Musculoskeletal:     Cervical back: No edema or erythema. No muscular tenderness.  Lymphadenopathy:     Head:     Right side of head: No submandibular adenopathy.     Left side of head: No submandibular adenopathy.     Cervical: No cervical adenopathy.  Skin:    General: Skin is warm.     Findings: No erythema or rash.  Neurological:     General: No focal deficit present.     Mental Status: She is alert and oriented to person, place, and time.     Cranial Nerves: No cranial nerve deficit.     Gait: Gait normal.  Psychiatric:        Mood and Affect: Mood and affect normal.   ASSESSMENT AND PLAN:  Ms. Meisel was seen today for hospital follow up, lump on throat, and headaches.   Mild intermittent asthma with acute exacerbation Assessment & Plan: With acute  exacerbation, she just completed prednisone  treatment. After verbal consent, she received breathing treatment with DuoNeb. Ventilation greatly improved, diffuse wheezing, no rales or rhonchi.  She agrees with holding on repeating prednisone  taper for now. Symbicort 160-4.5 mcg 2 puff twice daily. Albuterol  inh 2 puff every 6 hours for a week then as needed for wheezing or shortness of breath.  Follow-up in 4 weeks, before if needed.  Orders: -     Budesonide-Formoterol Fumarate; Inhale 2 puffs into the lungs 2 (two) times daily.  Dispense: 1 each; Refill: 3 -     AeroChamber Plus Flo-Vu; To use with inhalers.  Dispense: 1 each; Refill: 0 -     Ipratropium-Albuterol   Gastroesophageal reflux disease, unspecified whether esophagitis present Assessment & Plan: Problem is not well-controlled, could be contributing to persistent cough. Omeprazole 40 mg recommended, 30 minutes before first meal for 8 weeks then decrease dose to 20 mg and continue weaning of medication. GERD precautions are recommended.  Orders: -     Omeprazole; Take 1 capsule (40 mg total) by mouth daily.  Dispense: 60  capsule; Refill: 0  Abnormal CXR On 11/24/2023 small nodular focus projecting over the lateral right upper lung overlapping the right anterolateral third rib,?  Artifact versus pulmonary nodule.  Chest CT was recommended. Also respiratory symptoms since 10/2023 warrant chest CT.  -     CT CHEST WO CONTRAST; Future  Return in about 4 weeks (around 03/13/2024). And pap smear.  I, Fritz Jewel Wierda, acting as a scribe for Trayton Szabo [place], MD., have documented all relevant documentation on the behalf of Kemara Quigley [place], MD, as directed by  Lugene Beougher [place], MD while in the presence of Kalli Greenfield [place], MD.   I, Jaileen Janelle [place], MD, have reviewed all documentation for this visit. The documentation on 02/14/24 for the exam, diagnosis, procedures, and orders are all accurate and complete.  Elanda Garmany G. [place], MD  [facility]. [place] office.

## 2024-03-13 ENCOUNTER — Ambulatory Visit: Payer: Self-pay | Admitting: Family Medicine

## 2024-04-22 ENCOUNTER — Other Ambulatory Visit: Payer: Self-pay | Admitting: Family Medicine

## 2024-04-22 DIAGNOSIS — K219 Gastro-esophageal reflux disease without esophagitis: Secondary | ICD-10-CM
